# Patient Record
Sex: Male | Born: 1965 | Race: Black or African American | Hispanic: No | Marital: Married
Health system: Midwestern US, Community
[De-identification: ages and names within clinical notes are randomized; demographics above are authoritative.]

## PROBLEM LIST (undated history)

## (undated) DIAGNOSIS — R079 Chest pain, unspecified: Principal | ICD-10-CM

---

## 2018-09-04 ENCOUNTER — Institutional Professional Consult (permissible substitution): Payer: Self-pay | Admitting: Internal Medicine

## 2018-10-02 ENCOUNTER — Other Ambulatory Visit: Payer: Self-pay

## 2018-10-02 ENCOUNTER — Ambulatory Visit (INDEPENDENT_AMBULATORY_CARE_PROVIDER_SITE_OTHER): Payer: Self-pay

## 2018-10-02 ENCOUNTER — Encounter: Payer: Self-pay | Admitting: Internal Medicine

## 2018-10-02 ENCOUNTER — Ambulatory Visit (INDEPENDENT_AMBULATORY_CARE_PROVIDER_SITE_OTHER): Payer: Worker's Compensation | Admitting: Internal Medicine

## 2018-10-02 VITALS — BP 126/74 | HR 90 | Temp 98.4°F | Ht 78.0 in | Wt 330.6 lb

## 2018-10-02 DIAGNOSIS — R0789 Other chest pain: Secondary | ICD-10-CM

## 2018-10-02 DIAGNOSIS — R0609 Other forms of dyspnea: Secondary | ICD-10-CM

## 2018-10-02 NOTE — Patient Instructions (Signed)
Please remember to go to the  x-ray department  for your tests - we will call you with the results when they are available     You may need additional evaluation to address your pain over the lower portion of your right Rib cage but I don't think your Worker's comp will pay for it as unlikely to be related to anything you might have inhaled on the bus.

## 2018-10-02 NOTE — Progress Notes (Signed)
Luane School, male    DOB: 10-10-1965, 53 y.o.   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 and achieved a baseline = push mower x 30 min and walk dog up hills then  Acute exp to diesel fumes 07/28/17 > nausea, diaphoresis R/midline cp which the pt said never had before (notes says intermittent cp was on the L prior condition, pt denies)  > neg cardiac eval /   C0 levels not elevated  and cp/sob since then up to twice a week 2 min - 10 min happens least when lying down on cpap  On side and equal freq sitting vs walking    Baseline wt was 290   Had used nebulizer prior to event for "bronchitis"  Though MCT neg      History of Present Illness  10/02/2018  Pulmonary/ 1st office eval/[facility]  Chief Complaint  Patient presents with  . Pulmonary Consult    Worker's Comp. 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 He c/o DOE walking short distances such as lobby to exam room today.   Dyspnea:  MMRC3 = can't walk 100 yards even at a slow pace at a flat grade s stopping due to sob   Cough: gone now Sleep:  Ok with cpap  SABA use: not using /don't help sob   No obvious day to day or daytime variability or assoc excess/ purulent sputum or mucus plugs or hemoptysis or   chest tightness, subjective wheeze or overt sinus or hb symptoms.   Sleeping as above without nocturnal  or early am exacerbation  of respiratory  c/o's or need for noct saba. Also denies any obvious fluctuation of symptoms with weather or environmental changes or other aggravating or alleviating factors except as outlined above   No unusual exposure hx or h/o childhood pna/ asthma or knowledge of  premature birth.  Current Allergies, Complete Past Medical History, Past Surgical History, Family History, and Social History were reviewed in [employer] record.  ROS  The following are not active complaints unless bolded Hoarseness, sore throat, dysphagia, dental problems, itching, sneezing,  nasal congestion or discharge of excess mucus or purulent secretions, ear ache,   fever, chills, sweats, unintended wt loss or wt gain, classically pleuritic  cp,  orthopnea pnd or arm/hand swelling  or leg swelling, presyncope, palpitations, abdominal pain, anorexia, nausea, vomiting, diarrhea  or change in bowel habits or change in bladder habits, change in stools or change in urine, dysuria, hematuria,  rash, arthralgias, visual complaints, headache, numbness, weakness or ataxia or problems with walking or coordination,  change in mood or  memory.           No past medical history on file.  Outpatient Medications Prior to Visit  Medication Sig Dispense Refill  . acetaminophen (TYLENOL) 325 MG tablet Take 1 tablet by mouth every 6 (six) hours as needed.    Marland Kitchen albuterol (ACCUNEB) 1.25 MG/3ML nebulizer solution Take 1 ampule by nebulization every 6 (six) hours as needed for wheezing.    Marland Kitchen albuterol (VENTOLIN HFA) 108 (90 Base) MCG/ACT inhaler Inhale 2 puffs into the lungs every  6 (six) hours as needed for wheezing or shortness of breath.    Marland Kitchen atorvastatin (LIPITOR) 20 MG tablet Take 1 tablet by mouth daily.    . citalopram (CELEXA) 20 MG tablet Take 1 tablet by mouth daily.    . finasteride (PROSCAR) 5 MG tablet Take 1 tablet by mouth daily.    Marland Kitchen gabapentin (NEURONTIN) 300 MG capsule Take 1 capsule by mouth 3 (three) times daily.    Marland Kitchen glipiZIDE (GLUCOTROL XL) 5 MG 24 hr tablet Take 1 tablet by mouth daily.    . metFORMIN (GLUCOPHAGE-XR) 500 MG 24 hr tablet Take 2 tablets by mouth 2 (two) times a day.    . verapamil (CALAN-SR) 180 MG CR tablet Take 1 tablet by mouth daily.    Marland Kitchen  zolpidem (AMBIEN) 5 MG tablet Take 1 tablet by mouth at bedtime as needed.        Objective:     BP 126/74 (BP Location: Left Arm, Cuff Size: Large)   Pulse 90   Temp 98.4 F (36.9 C) (Oral)   Ht 6\' 6"  (1.981 m)   Wt (!) 330 lb 9.6 oz (150 kg)   SpO2 98%   BMI 38.20 kg/m   SpO2: 98 %   RA  Pleasant obese amb bm nad   HEENT: nl dentition, turbinates bilaterally, and oropharynx. Nl external ear canals without cough reflex   NECK :  without JVD/Nodes/TM/ nl carotid upstrokes bilaterally   LUNGS: no acc muscle use,  Nl contour chest which is clear to A and P bilaterally without cough on insp or exp maneuvers - R ant chest wall with focal tenderness and ? Some sts in mid clavicular line near costophrenic border(note prior exams noted tenderness L ant chest wall)    CV:  RRR  no s3 or murmur or increase in P2, and no edema   ABD:  Obese soft with mild RUQ tenderness ? Pos murphy's but   with nl inspiratory excursion in the supine position. No bruits or organomegaly appreciated, bowel sounds nl  MS:  Nl gait/ ext warm without deformities, calf tenderness, cyanosis or clubbing No obvious joint restrictions   SKIN: warm and dry without lesions    NEURO:  alert, approp, nl sensorium with  no motor or cerebellar deficits apparent.      I personally reviewed images and agree with radiology impression as follows:   Chest CT w contrast 03/09/2018  Non-specific and very mild streaky opacities c/w scarring /atx   CXR PA and Lateral:   10/02/2018 :    I personally reviewed images and agree with radiology impression as follows:   No acute cardiopulmonary process      Assessment   DOE (dyspnea on exertion) Onset p diesel fume exposure 07/28/17 with nl C0 levels in ER  - See pulmonary eval from [facility] 06/14/2018 outlining neg w/u x for ct chest 03/09/18 with mild streaky atx and granulomatous changes ? 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  The nl CO levels rule out significant CO toxicity unless done many hours after started on 100% 02 (which I don't believe this to be the case as  toxic levels wash out that quickly) and the cp does not sound cardiac or pulmonary in natures (see separate a/p).  He may need f/u related to nonspecific scarring seen on CT 03/09/18 but since this is not likely related to work exposure I did not want to rec any studies that his coverage would not pay for and directed him back to his PCP to work out.     Atypical chest pain Assoc with ant chest wall tenderness on L on 07/28/17 and R ant chest wall tenderness  10/02/2018 with lots of gas below diaphragms   No evidence of a cardiac or pulmonary problem causing this atypical chest wall pain syndrome that is clearly migratory and typical of either fibromyagia or more likely a form of ibs:  Classic subdiaphragmatic pain pattern suggests ibs:  Stereotypical, migratory with a very limited distribution of pain locations, daytime, not usually exacerbated by exercise  or coughing, worse in sitting position, frequently associated with generalized abd bloating, not as likely to be present supine due to the dome effect of the diaphragm which  is  canceled in that position. Frequently these patients have had multiple negative cardiac/GI workups and CT scans which is the case here and I didn't add to this today but rec   W/u per PCP since not related to worker's comp issue with trial of ibs diet (not foods that cause gas) and citrucel and then consider abd u/s to complete w/u if not improving on above  Pulmonary f/u is prn    .        Morbid (severe) obesity due to excess calories (HCC) complicated by hbp/DM Body mass index is 38.2  kg/m.    No results found for: TSH   Contributing to gerd risk/ doe/reviewed the need and the process to achieve and maintain neg calorie balance > defer f/u primary care including intermittently monitoring thyroid status         Total time devoted to counseling  > 50 % of initial 60 min office visit:  review case with pt/ personally observed amb 02 sat study/discussion of options/alternatives/ personally creating written customized instructions  in presence of pt  then going over those specific  Instructions directly with the pt including how to use all of the meds but in particular covering each new medication in detail and the difference between the maintenance= "automatic" meds and the prns using an action plan format for the latter (If this problem/symptom => do that organization reading Left to right).  Please see AVS from this visit for a full list of these instructions which I personally wrote for this pt and  are unique to this visit.      Sandrea HughsMichael Edee Nifong, MD 10/02/2018

## 2018-10-03 ENCOUNTER — Encounter: Payer: Self-pay | Admitting: Internal Medicine

## 2018-10-03 DIAGNOSIS — R0789 Other chest pain: Secondary | ICD-10-CM | POA: Insufficient documentation

## 2018-10-03 NOTE — Progress Notes (Signed)
Spoke with pt and notified of results per Dr. Wert. Pt verbalized understanding and denied any questions. 

## 2018-10-03 NOTE — Assessment & Plan Note (Addendum)
Onset p diesel fume exposure 07/28/17 with nl C0 levels in ER  - See pulmonary eval from [facility] 06/14/2018 outlining neg w/u x for ct chest 03/09/18 with mild streaky atx and granulomatous changes ? 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  The nl CO levels rule out significant CO toxicity unless done many hours after started on 100% 02 (which I don't believe this to be the case as toxic levels wash out that quickly) and the cp does not sound cardiac or pulmonary in natures (see separate a/p).  He may need f/u related to nonspecific scarring seen on CT 03/09/18 but since this is not likely related to work exposure I did not want to rec any studies that his coverage would not pay for and directed him back to his PCP to work out.

## 2018-10-03 NOTE — Assessment & Plan Note (Addendum)
Body mass index is 38.2 kg/m.   No results found for: TSH   Contributing to gerd risk/ doe/reviewed the need and the process to achieve and maintain neg calorie balance > defer f/u primary care including intermittently monitoring thyroid status       Total time devoted to counseling  > 50 % of initial 60 min office visit:  review case with pt/personally observed amb 02 sat study/  discussion of options/alternatives/ personally creating written customized instructions  in presence of pt  then going over those specific  Instructions directly with the pt including how to use all of the meds but in particular covering each new medication in detail and the difference between the maintenance= "automatic" meds and the prns using an action plan format for the latter (If this problem/symptom => do that organization reading Left to right).  Please see AVS from this visit for a full list of these instructions which I personally wrote for this pt and  are unique to this visit.

## 2018-10-03 NOTE — Assessment & Plan Note (Addendum)
Assoc with ant chest wall tenderness on L on 07/28/17 and R ant chest wall tenderness  10/02/2018 with lots of gas below diaphragms   No evidence of a cardiac or pulmonary problem causing this atypical chest wall pain syndrome that is clearly migratory and typical of either fibromyagia or more likely a form of ibs:  Classic subdiaphragmatic pain pattern suggests ibs:  Stereotypical, migratory with a very limited distribution of pain locations, daytime, not usually exacerbated by exercise  or coughing, worse in sitting position, frequently associated with generalized abd bloating, not as likely to be present supine due to the dome effect of the diaphragm which  is  canceled in that position. Frequently these patients have had multiple negative cardiac/GI workups and CT scans which is the case here and I didn't add to this today but rec   W/u per PCP since not related to worker's comp issue with trial of ibs diet (not foods that cause gas) and citrucel and then consider abd u/s to complete w/u if not improving on above  Pulmonary f/u is prn    .

## 2019-12-05 IMAGING — DX CHEST - 2 VIEW
2 series · 2 of 2 positions shown · non-contrast
Comparison: None.

CLINICAL DATA: Shortness of breath.

EXAM:
CHEST - 2 VIEW

[chest pa]
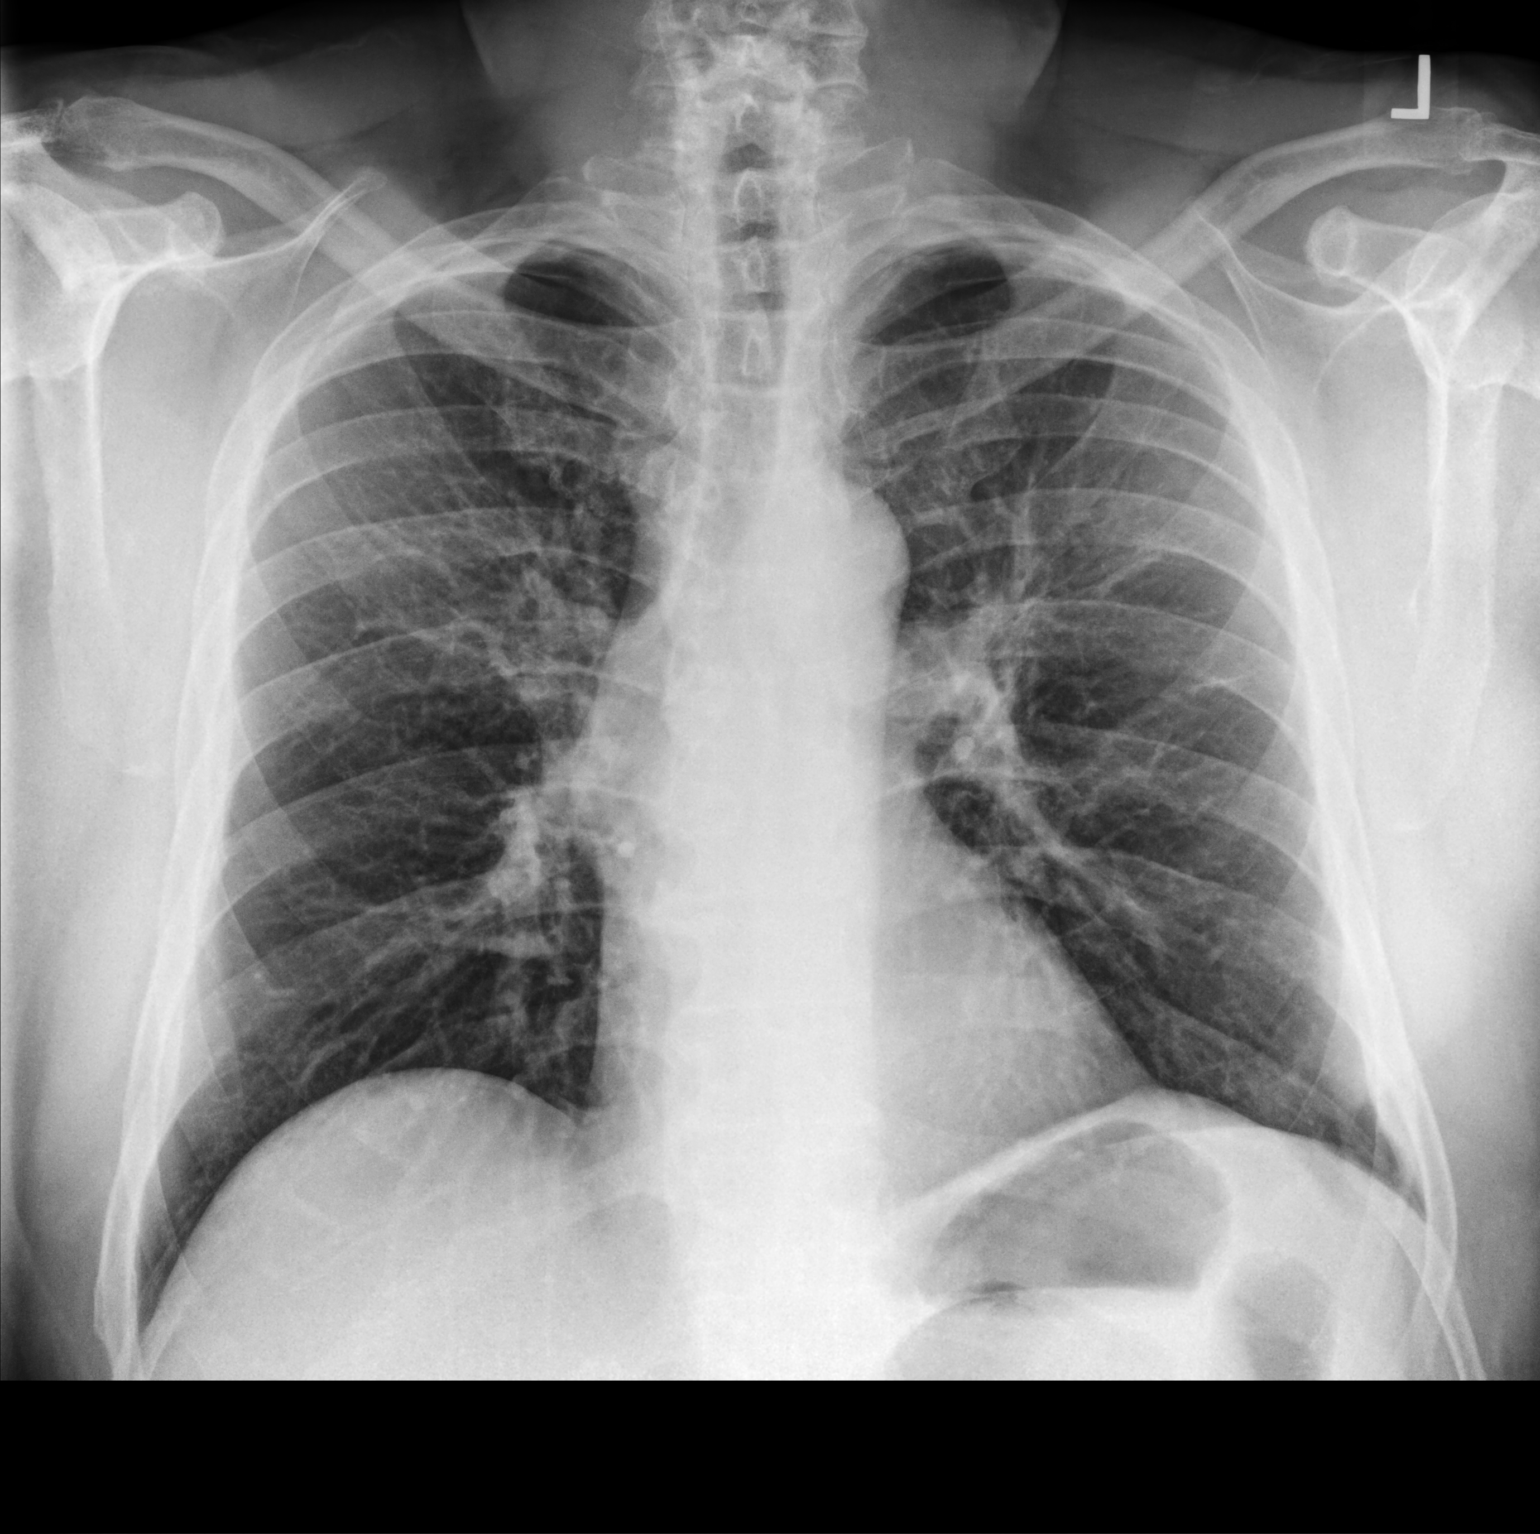

[chest lat]
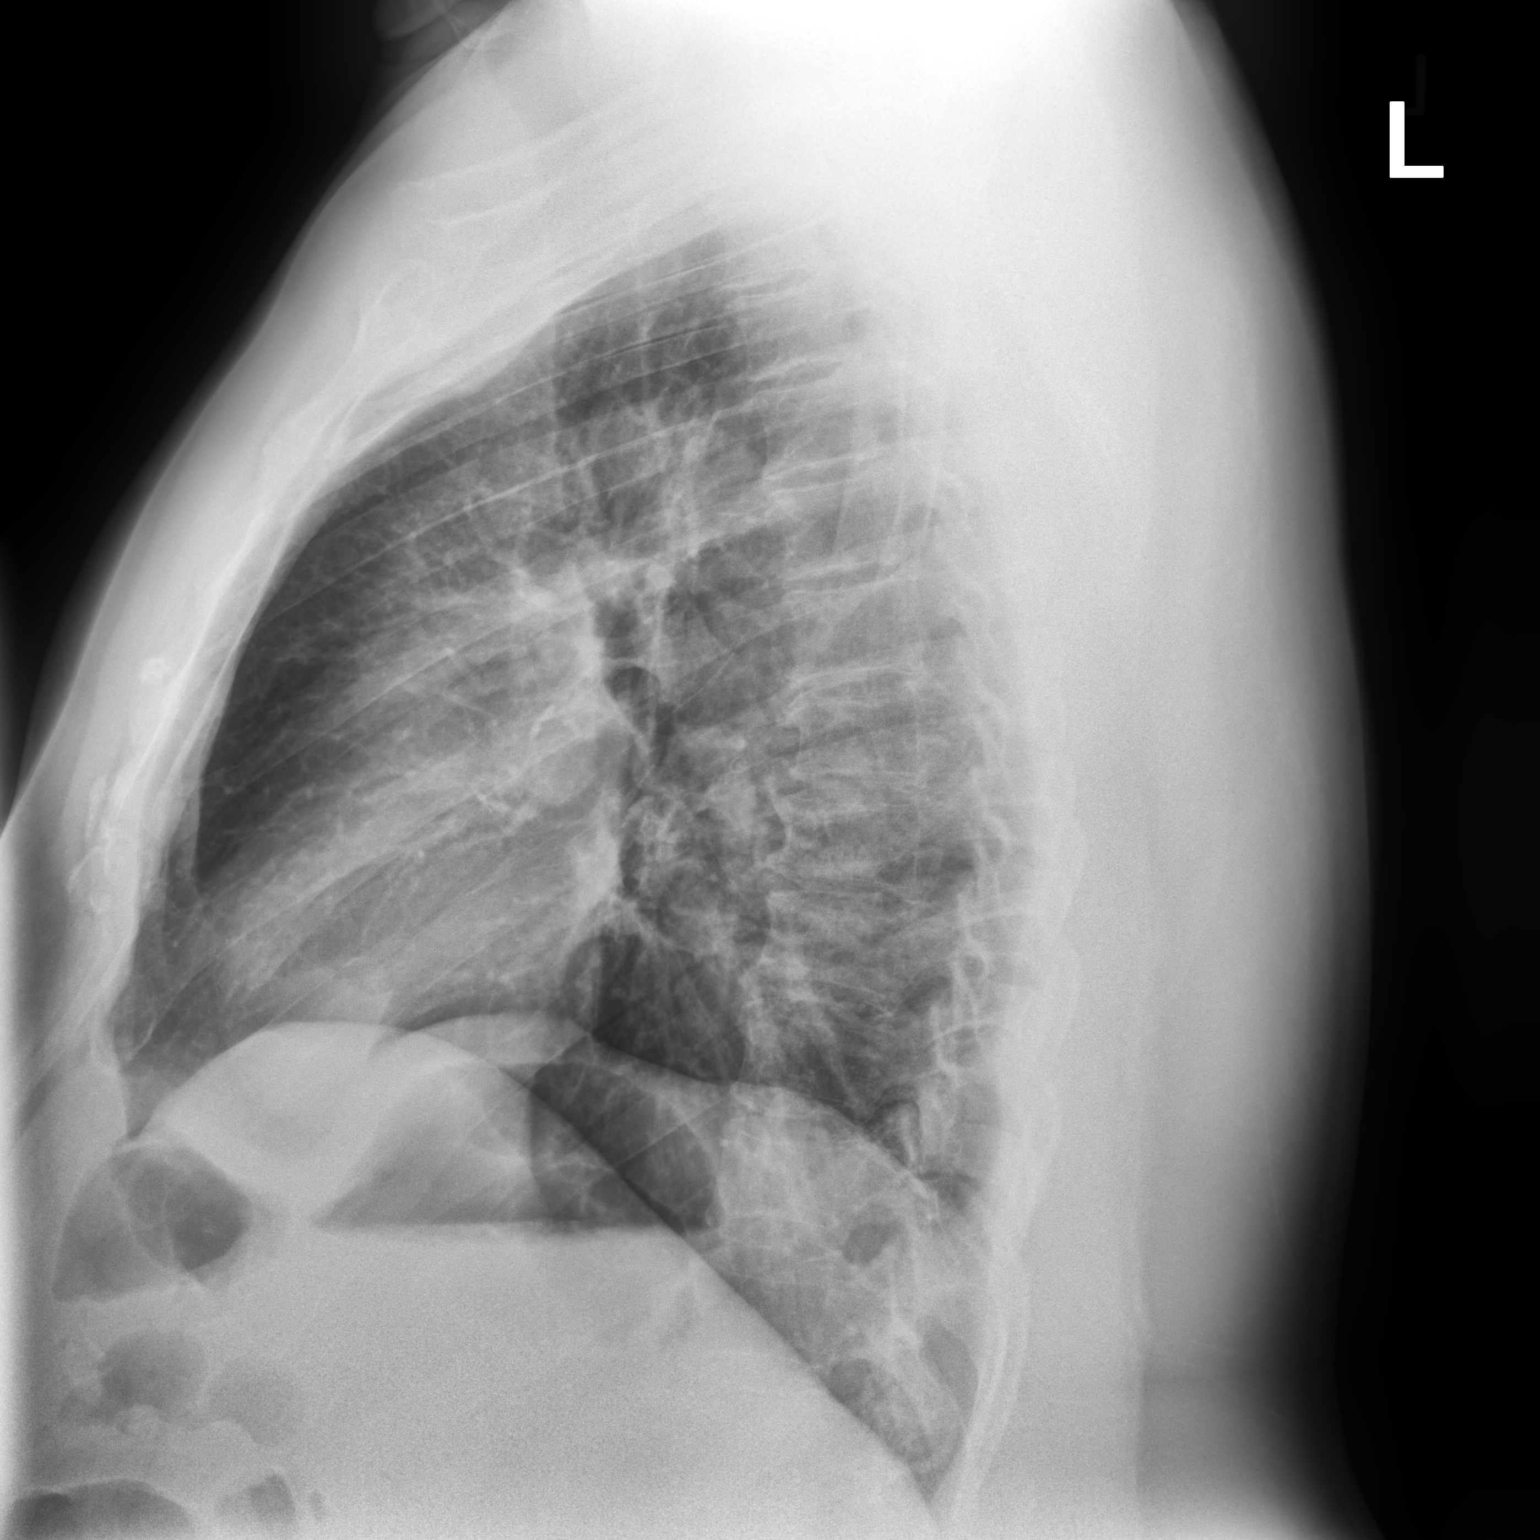

[2 of 2 positions shown; findings below may reference images not displayed]

FINDINGS: Normal cardiac and mediastinal contours. No consolidative pulmonary
opacities. No pleural effusion or pneumothorax. Thoracic spine
degenerative changes.
IMPRESSION: No acute cardiopulmonary process.

## 2021-06-11 ENCOUNTER — Emergency Department: Admit: 2021-06-12 | Payer: MEDICAID

## 2021-06-11 ENCOUNTER — Inpatient Hospital Stay
Admit: 2021-06-11 | Discharge: 2021-06-12 | Disposition: A | Discharge status: Home / Self Care | Payer: MEDICAID | Attending: Emergency Medicine

## 2021-06-11 DIAGNOSIS — R079 Chest pain, unspecified: Secondary | ICD-10-CM

## 2021-06-11 NOTE — ED Triage Notes (Signed)
 Formatting of this note might be different from the original.  Pt states that he was having relations with his wife last night and started to have chest pain.  Has been having chest pain off and on prior to this.   Pain is centered to his left breast,   Electronically signed by Ward, Eddie Good at 06/11/2021  7:16 PM EST

## 2021-06-11 NOTE — ED Notes (Signed)
Pt states that he was having relations with his wife last night and started to have chest pain.  Has been having chest pain off and on prior to this.   Pain is centered to his left breast,

## 2021-06-11 NOTE — ED Provider Notes (Signed)
 Formatting of this note is different from the original.  Pt c/o chest pain, started during exertion one day ago, wax/waning since.  Worse w sitting up.  + sob w exertion.  No fever. No cough.  No leg pain or swelling.  H/o same in past.  Here from out of town.  Due to see cardiology next week for freq chest pain.   H/o iddm, says gluc well controlled.  H/o cva after surgery, w left arm numbness.   Took asa this am.   Pt states h/o chronic renal insufficiency.       Past Medical History:   Diagnosis Date    CKD (chronic kidney disease) stage 2, GFR 60-89 ml/min     CVA (cerebral vascular accident) (HCC)     Diabetes (HCC)     HTN (hypertension)      Past Surgical History:   Procedure Laterality Date    HX BACK SURGERY      HX KNEE REPLACEMENT Left        History reviewed. No pertinent family history.    Social History     Socioeconomic History    Marital status: MARRIED     Spouse name: Not on file    Number of children: Not on file    Years of education: Not on file    Highest education level: Not on file   Occupational History    Not on file   Tobacco Use    Smoking status: Never    Smokeless tobacco: Never   Substance and Sexual Activity    Alcohol use: Not Currently    Drug use: Not Currently    Sexual activity: Not on file   Other Topics Concern    Not on file   Social History Narrative    Not on file     Social Determinants of Health     Financial Resource Strain: Not on file   Food Insecurity: Not on file   Transportation Needs: Not on file   Physical Activity: Not on file   Stress: Not on file   Social Connections: Not on file   Intimate Partner Violence: Not on file   Housing Stability: Not on file     ALLERGIES: Penicillins    Review of Systems   Constitutional:  Negative for diaphoresis and fever.   HENT:  Negative for congestion.    Respiratory:  Positive for shortness of breath. Negative for cough.    Cardiovascular:  Positive for chest pain.   Gastrointestinal:  Negative for abdominal pain and nausea.    Musculoskeletal:  Negative for back pain.   Skin:  Negative for rash.   Neurological:  Negative for dizziness.   All other systems reviewed and are negative.    Vitals:    06/11/21 2015 06/11/21 2115 06/11/21 2217 06/11/21 2315   BP: 131/74 128/80 134/79 (!) 114/55   Pulse: 64 (!) 55 (!) 44 (!) 57   Resp: 16 22 18 16    Temp:       SpO2: 94% 98% 98% 96%   Weight:       Height:           Physical Exam  Vitals and nursing note reviewed.   Constitutional:       Appearance: He is well-developed.   HENT:      Head: Normocephalic and atraumatic.   Eyes:      Conjunctiva/sclera: Conjunctivae normal.   Cardiovascular:      Rate and Rhythm:  Normal rate and regular rhythm.   Pulmonary:      Effort: Pulmonary effort is normal.      Breath sounds: No wheezing.   Abdominal:      Palpations: Abdomen is soft.      Tenderness: There is no abdominal tenderness.   Musculoskeletal:         General: No tenderness.      Cervical back: Normal range of motion.   Skin:     General: Skin is warm and dry.      Capillary Refill: Capillary refill takes less than 2 seconds.      Findings: No rash.   Neurological:      Mental Status: He is alert and oriented to person, place, and time.   Psychiatric:         Mood and Affect: Mood normal.       Medical Decision Making  Amount and/or Complexity of Data Reviewed  Labs: ordered.  Radiology: ordered.  ECG/medicine tests: ordered.    Risk  Prescription drug management.        Procedures    Vitals:  No data found.    Medications ordered:   Medications   0.9% sodium chloride infusion 500 mL (0 mL IntraVENous IV Completed 06/11/21 2005)   cyclobenzaprine (FLEXERIL) tablet 10 mg (10 mg Oral Given 06/11/21 2033)       Lab findings:  No results found for this or any previous visit (from the past 12 hour(s)).    X-Ray, CT or other radiology findings or impressions:  XR CHEST PA LAT   Final Result     1. No evidence of active cardiopulmonary disease.       Progress notes, Consult notes or additional Procedure  notes:   11:51 PM pt feels much better, no pain after flexeril po, no complaints, req dc.  Stable for dc and close f.up.  det ret inst given. No emc. Not c/w acs/pe/ptx/pna/dissection/sepsis/acute abd.  Pt verb und of det ret inst givne and agrees w dc plan.   No ind for cta chest, neg d-dimer.   Pt told of renal insuff, he and spouse says h/o same.  Pcp in [place] is following.     Diagnosis:   1. Chest pain, unspecified type    2. Chronic renal impairment, unspecified CKD stage      Disposition: home    Follow-up Information       Follow up With Specialties Details Why Contact Info    [facility] EMERGENCY DEPT Emergency Medicine Go to  As needed, If symptoms worsen [address]  [place]  [address]  [phone]    Rockford Churches, MD Family Medicine Schedule an appointment as soon as possible for a visit in 2 days  [address]  [address]  [place] [address]  [phone]     Celine Collard, MD Cardio Vascular Surgery, Cardiovascular Disease Physician Schedule an appointment as soon as possible for a visit in 2 days  [address] [place]  [address]  [place] [address]  [phone]            Discharge Medication List as of 06/11/2021 11:51 PM       CONTINUE these medications which have NOT CHANGED    Details   fenofibrate nanocrystallized (TRICOR) 48 mg tablet Take 48 mg by mouth., Historical Med     hydrALAZINE (APRESOLINE) 100 mg tablet Take 100 mg by mouth three (3) times daily., Historical Med  labetaloL (NORMODYNE) 200 mg tablet Take 200 mg by mouth two (2) times a day., Historical Med     NIFEdipine ER (PROCARDIA XL) 90 mg ER tablet Take 90 mg by mouth daily., Historical Med     allopurinoL (ZYLOPRIM) 300 mg tablet Take 300 mg by mouth daily., Historical Med     DULoxetine (CYMBALTA) 30 mg capsule Take 30 mg by mouth daily., Historical Med     aspirin 81 mg chewable tablet Take 81 mg by mouth daily., Historical Med     gabapentin (NEURONTIN) 300 mg capsule Take 300 mg by mouth two (2) times a  day., Historical Med     atorvastatin (LIPITOR) 20 mg tablet Take 20 mg by mouth daily., Historical Med     insulin glargine (LANTUS,BASAGLAR) 100 unit/mL (3 mL) inpn 18 Units by SubCUTAneous route two (2) times a day., Historical Med               Electronically signed by Ever Hiss, MD at 06/13/2021  7:55 PM EST

## 2021-06-11 NOTE — ED Provider Notes (Signed)
Pt c/o chest pain, started during exertion one day ago, wax/waning since.  Worse w sitting up.  + sob w exertion.  No fever. No cough.  No leg pain or swelling.  H/o same in past.  Here from out of town.  Due to see cardiology next week for freq chest pain.   H/o iddm, says gluc well controlled.  H/o cva after surgery, w left arm numbness.   Took asa this am.   Pt states h/o chronic renal insufficiency.        Past Medical History:   Diagnosis Date    CKD (chronic kidney disease) stage 2, GFR 60-89 ml/min     CVA (cerebral vascular accident) ([place])     Diabetes ([place])     HTN (hypertension)        Past Surgical History:   Procedure Laterality Date    HX BACK SURGERY      HX KNEE REPLACEMENT Left          History reviewed. No pertinent family history.    Social History     Socioeconomic History    Marital status: MARRIED     Spouse name: Not on file    Number of children: Not on file    Years of education: Not on file    Highest education level: Not on file   Occupational History    Not on file   Tobacco Use    Smoking status: Never    Smokeless tobacco: Never   Substance and Sexual Activity    Alcohol use: Not Currently    Drug use: Not Currently    Sexual activity: Not on file   Other Topics Concern    Not on file   Social History Narrative    Not on file     Social Determinants of Health     Financial Resource Strain: Not on file   Food Insecurity: Not on file   Transportation Needs: Not on file   Physical Activity: Not on file   Stress: Not on file   Social Connections: Not on file   Intimate Partner Violence: Not on file   Housing Stability: Not on file         ALLERGIES: Penicillins    Review of Systems   Constitutional:  Negative for diaphoresis and fever.   HENT:  Negative for congestion.    Respiratory:  Positive for shortness of breath. Negative for cough.    Cardiovascular:  Positive for chest pain.   Gastrointestinal:  Negative for abdominal pain and nausea.   Musculoskeletal:  Negative for back pain.    Skin:  Negative for rash.   Neurological:  Negative for dizziness.   All other systems reviewed and are negative.    Vitals:    06/11/21 2015 06/11/21 2115 06/11/21 2217 06/11/21 2315   BP: 131/74 128/80 134/79 (!) 114/55   Pulse: 64 (!) 55 (!) 44 (!) 57   Resp: '16 22 18 16   ' Temp:       SpO2: 94% 98% 98% 96%   Weight:       Height:                Physical Exam  Vitals and nursing note reviewed.   Constitutional:       Appearance: He is well-developed.   HENT:      Head: Normocephalic and atraumatic.   Eyes:      Conjunctiva/sclera: Conjunctivae normal.   Cardiovascular:  Rate and Rhythm: Normal rate and regular rhythm.   Pulmonary:      Effort: Pulmonary effort is normal.      Breath sounds: No wheezing.   Abdominal:      Palpations: Abdomen is soft.      Tenderness: There is no abdominal tenderness.   Musculoskeletal:         General: No tenderness.      Cervical back: Normal range of motion.   Skin:     General: Skin is warm and dry.      Capillary Refill: Capillary refill takes less than 2 seconds.      Findings: No rash.   Neurological:      Mental Status: He is alert and oriented to person, place, and time.   Psychiatric:         Mood and Affect: Mood normal.        Medical Decision Making  Amount and/or Complexity of Data Reviewed  Labs: ordered.  Radiology: ordered.  ECG/medicine tests: ordered.    Risk  Prescription drug management.           Procedures      Vitals:  No data found.        Medications ordered:   Medications   0.9% sodium chloride infusion 500 mL (0 mL IntraVENous IV Completed 06/11/21 2005)   cyclobenzaprine (FLEXERIL) tablet 10 mg (10 mg Oral Given 06/11/21 2033)         Lab findings:  No results found for this or any previous visit (from the past 12 hour(s)).          X-Ray, CT or other radiology findings or impressions:  XR CHEST PA LAT   Final Result      1. No evidence of active cardiopulmonary disease.                Progress notes, Consult notes or additional Procedure notes:    11:51 PM pt feels much better, no pain after flexeril po, no complaints, req dc.  Stable for dc and close f.up.  det ret inst given. No emc. Not c/w acs/pe/ptx/pna/dissection/sepsis/acute abd.  Pt verb und of det ret inst givne and agrees w dc plan.   No ind for cta chest, neg d-dimer.   Pt told of renal insuff, he and spouse says h/o same.  Pcp in [place] is following.       Diagnosis:   1. Chest pain, unspecified type    2. Chronic renal impairment, unspecified CKD stage        Disposition: home    Follow-up Information       Follow up With Specialties Details Why Contact Info    [facility] EMERGENCY DEPT Emergency Medicine Go to  As needed, If symptoms worsen [facility]    Arlana Lindau, MD Family Medicine Schedule an appointment as soon as possible for a visit in 2 days  [place] [id number]  [phone]      Floreen Comber, MD [facility], Cardiovascular Disease Physician Schedule an appointment as soon as possible for a visit in 2 days  [address]  [place] [address]  [phone]               Discharge Medication List as of 06/11/2021 11:51 PM        CONTINUE these medications which have NOT CHANGED  Details   fenofibrate nanocrystallized (TRICOR) 48 mg tablet Take 48 mg by mouth., Historical Med      hydrALAZINE (APRESOLINE) 100 mg tablet Take 100 mg by mouth three (3) times daily., Historical Med      labetaloL (NORMODYNE) 200 mg tablet Take 200 mg by mouth two (2) times a day., Historical Med      NIFEdipine ER (PROCARDIA XL) 90 mg ER tablet Take 90 mg by mouth daily., Historical Med      allopurinoL (ZYLOPRIM) 300 mg tablet Take 300 mg by mouth daily., Historical Med      DULoxetine (CYMBALTA) 30 mg capsule Take 30 mg by mouth daily., Historical Med      aspirin 81 mg chewable tablet Take 81 mg by mouth daily., Historical Med      gabapentin (NEURONTIN) 300 mg capsule Take 300 mg by mouth two (2)  times a day., Historical Med      atorvastatin (LIPITOR) 20 mg tablet Take 20 mg by mouth daily., Historical Med      insulin glargine (LANTUS,BASAGLAR) 100 unit/mL (3 mL) inpn 18 Units by SubCUTAneous route two (2) times a day., Historical Med

## 2021-06-11 NOTE — ED Notes (Signed)
Called to bedside by spouse, HR 44-48 on monitor- blood pressure 134/82 continues to have some chest discomfort. Dr Laural Benes at bedside.

## 2021-06-11 NOTE — ED Notes (Signed)
 Formatting of this note might be different from the original.  Called to bedside by spouse, HR 44-48 on monitor- blood pressure 134/82 continues to have some chest discomfort. Dr Lincoln Renshaw at bedside.  Electronically signed by Ruven Coy S at 06/12/2021  3:40 AM EST

## 2021-06-12 LAB — METABOLIC PANEL, COMPREHENSIVE
A-G Ratio: 1.1 (ref 0.8–1.7)
ALT (SGPT): 25 U/L (ref 16–61)
AST (SGOT): 13 U/L (ref 10–38)
Albumin: 3.8 g/dL (ref 3.4–5.0)
Alk. phosphatase: 96 U/L (ref 45–117)
Anion gap: 8 mmol/L (ref 3.0–18.0)
BUN/Creatinine ratio: 16 (ref 12–20)
BUN: 28 mg/dL — ABNORMAL HIGH (ref 7–18)
Bilirubin, total: 0.2 mg/dL (ref 0.2–1.0)
CO2: 27 mmol/L (ref 21–32)
Calcium: 9.2 mg/dL (ref 8.5–10.1)
Chloride: 105 mmol/L (ref 100–111)
Creatinine: 1.71 mg/dL — ABNORMAL HIGH (ref 0.60–1.30)
Globulin: 3.5 g/dL (ref 2.0–4.0)
Glucose: 136 mg/dL — ABNORMAL HIGH (ref 74–99)
Potassium: 3.6 mmol/L (ref 3.5–5.5)
Protein, total: 7.3 g/dL (ref 6.4–8.2)
Sodium: 140 mmol/L (ref 136–145)
eGFR: 47 mL/min/{1.73_m2} — ABNORMAL LOW (ref >60)

## 2021-06-12 LAB — CBC WITH AUTOMATED DIFF
ABS. BASOPHILS: 0.1 10*3/uL (ref 0.0–0.1)
ABS. EOSINOPHILS: 0.2 10*3/uL (ref 0.0–0.4)
ABS. IMM. GRANS.: 0 10*3/uL (ref 0.00–0.04)
ABS. LYMPHOCYTES: 1.5 10*3/uL (ref 0.9–3.6)
ABS. MONOCYTES: 0.5 10*3/uL (ref 0.05–1.2)
ABS. NEUTROPHILS: 3.3 10*3/uL (ref 1.8–8.0)
ABSOLUTE NRBC: 0 10*3/uL (ref 0.00–0.01)
BASOPHILS: 1 % (ref 0–2)
EOSINOPHILS: 4 % (ref 0–5)
HCT: 38.1 % (ref 36.0–48.0)
HGB: 12.7 g/dL — ABNORMAL LOW (ref 13.0–16.0)
IMMATURE GRANULOCYTES: 0 % (ref 0–0.5)
LYMPHOCYTES: 26 % (ref 21–52)
MCH: 27.1 PG (ref 24.0–34.0)
MCHC: 33.3 g/dL (ref 31.0–37.0)
MCV: 81.4 FL (ref 78.0–100.0)
MONOCYTES: 10 % (ref 3–10)
MPV: 11.3 FL (ref 9.2–11.8)
NEUTROPHILS: 59 % (ref 40–73)
NRBC: 0 PER 100 WBC
PLATELET: 214 10*3/uL (ref 135–420)
RBC: 4.68 M/uL (ref 4.35–5.65)
RDW: 13.5 % (ref 11.6–14.5)
WBC: 5.5 10*3/uL (ref 4.6–13.2)

## 2021-06-12 LAB — EKG, 12 LEAD, SUBSEQUENT
Atrial Rate: 47 {beats}/min
Calculated P Axis: 7 degrees
Calculated R Axis: 13 degrees
Calculated T Axis: 32 degrees
P-R Interval: 192 ms
Q-T Interval: 481 ms
QRS Duration: 88 ms
QTC Calculation (Bezet): 430 ms
Ventricular Rate: 48 {beats}/min

## 2021-06-12 LAB — EKG, 12 LEAD, INITIAL
Atrial Rate: 73 {beats}/min
Calculated P Axis: 31 degrees
Calculated R Axis: 5 degrees
Calculated T Axis: 44 degrees
P-R Interval: 184 ms
Q-T Interval: 405 ms
QRS Duration: 85 ms
QTC Calculation (Bezet): 447 ms
Ventricular Rate: 73 {beats}/min

## 2021-06-12 LAB — MAGNESIUM
Magnesium: 2 mg/dL (ref 1.6–2.6)
Magnesium: 2 mg/dL (ref 1.6–2.6)

## 2021-06-12 LAB — TROPONIN-HIGH SENSITIVITY
Troponin-High Sensitivity: 9 ng/L (ref 0–78)
Troponin-High Sensitivity: 8 ng/L (ref 0–78)

## 2021-06-12 LAB — D DIMER: D DIMER: 0.35 ug/ml(FEU) (ref <0.46)

## 2021-06-12 LAB — LIPASE
Lipase: 92 U/L (ref 73–393)
Lipase: 92 U/L (ref 73–393)

## 2021-06-12 LAB — NT-PRO BNP: NT pro-BNP: 33 pg/mL (ref 0–900)

## 2021-06-12 LAB — COMPREHENSIVE METABOLIC PANEL
ALT: 25 U/L (ref 16–61)
AST: 13 U/L (ref 10–38)
Albumin/Globulin Ratio: 1.1 (ref 0.8–1.7)
Albumin: 3.8 g/dL (ref 3.4–5.0)
Alkaline Phosphatase: 96 U/L (ref 45–117)
Anion Gap: 8 mmol/L (ref 3.0–18.0)
BUN: 28 mg/dL — ABNORMAL HIGH (ref 7–18)
Bun/Cre Ratio: 16 (ref 12–20)
CO2: 27 mmol/L (ref 21–32)
Calcium: 9.2 mg/dL (ref 8.5–10.1)
Chloride: 105 mmol/L (ref 100–111)
Creatinine: 1.71 mg/dL — ABNORMAL HIGH (ref 0.60–1.30)
ESTIMATED GLOMERULAR FILTRATION RATE: 47 mL/min/{1.73_m2} — ABNORMAL LOW (ref >60)
Globulin: 3.5 g/dL (ref 2.0–4.0)
Glucose: 136 mg/dL — ABNORMAL HIGH (ref 74–99)
Potassium: 3.6 mmol/L (ref 3.5–5.5)
Sodium: 140 mmol/L (ref 136–145)
Total Bilirubin: 0.2 mg/dL (ref 0.2–1.0)
Total Protein: 7.3 g/dL (ref 6.4–8.2)

## 2021-06-12 LAB — CBC WITH AUTO DIFFERENTIAL
Basophils %: 1 % (ref 0–2)
Basophils Absolute: 0.1 10*3/uL (ref 0.0–0.1)
Eosinophils %: 4 % (ref 0–5)
Eosinophils Absolute: 0.2 10*3/uL (ref 0.0–0.4)
Granulocyte Absolute Count: 0 10*3/uL (ref 0.00–0.04)
Hematocrit: 38.1 % (ref 36.0–48.0)
Hemoglobin: 12.7 g/dL — ABNORMAL LOW (ref 13.0–16.0)
Immature Granulocytes: 0 % (ref 0–0.5)
Lymphocytes %: 26 % (ref 21–52)
Lymphocytes Absolute: 1.5 10*3/uL (ref 0.9–3.6)
MCH: 27.1 PG (ref 24.0–34.0)
MCHC: 33.3 g/dL (ref 31.0–37.0)
MCV: 81.4 FL (ref 78.0–100.0)
MPV: 11.3 FL (ref 9.2–11.8)
Monocytes %: 10 % (ref 3–10)
Monocytes Absolute: 0.5 10*3/uL (ref 0.05–1.2)
NRBC Absolute: 0 10*3/uL (ref 0.00–0.01)
Neutrophils %: 59 % (ref 40–73)
Neutrophils Absolute: 3.3 10*3/uL (ref 1.8–8.0)
Nucleated RBCs: 0 PER 100 WBC
Platelets: 214 10*3/uL (ref 135–420)
RBC: 4.68 M/uL (ref 4.35–5.65)
RDW: 13.5 % (ref 11.6–14.5)
WBC: 5.5 10*3/uL (ref 4.6–13.2)

## 2021-06-12 LAB — EKG 12-LEAD
Atrial Rate: 47 {beats}/min
Atrial Rate: 73 {beats}/min
P Axis: 31 degrees
P Axis: 7 degrees
P-R Interval: 184 ms
P-R Interval: 192 ms
Q-T Interval: 405 ms
Q-T Interval: 481 ms
QRS Duration: 85 ms
QRS Duration: 88 ms
QTc Calculation (Bazett): 430 ms
QTc Calculation (Bazett): 447 ms
R Axis: 13 degrees
R Axis: 5 degrees
T Axis: 32 degrees
T Axis: 44 degrees
Ventricular Rate: 48 {beats}/min
Ventricular Rate: 73 {beats}/min

## 2021-06-12 LAB — TROPONIN, HIGH SENSITIVITY
Troponin, High Sensitivity: 9 ng/L (ref 0–78)
Troponin, High Sensitivity: 8 ng/L (ref 0–78)

## 2021-06-12 LAB — D-DIMER, QUANTITATIVE: D-Dimer, Quant: 0.35 ug/ml(FEU) (ref <0.46)

## 2021-06-12 LAB — PROBNP, N-TERMINAL: BNP: 33 pg/mL (ref 0–900)

## 2021-06-12 MED ORDER — SODIUM CHLORIDE 0.9 % IV
Freq: Once | INTRAVENOUS | Status: AC
Start: 2021-06-12 — End: 2021-06-11
  Administered 2021-06-12: via INTRAVENOUS

## 2021-06-12 MED ORDER — CYCLOBENZAPRINE 10 MG TAB
10 mg | ORAL | Status: AC
Start: 2021-06-12 — End: 2021-06-11
  Administered 2021-06-12: 02:00:00 via ORAL

## 2021-06-12 MED ORDER — HYDROCODONE-ACETAMINOPHEN 5 MG-325 MG TAB
5-325 mg | ORAL | Status: DC
Start: 2021-06-12 — End: 2021-06-12

## 2021-06-12 MED FILL — SODIUM CHLORIDE 0.9 % IV: INTRAVENOUS | Qty: 1000

## 2021-06-12 MED FILL — HYDROCODONE-ACETAMINOPHEN 5 MG-325 MG TAB: 5-325 mg | ORAL | Qty: 1

## 2021-06-12 MED FILL — CYCLOBENZAPRINE 10 MG TAB: 10 mg | ORAL | Qty: 1

## 2021-06-12 NOTE — ED Notes (Signed)
I have reviewed discharge instructions with the patient and spouse.  The patient and spouse verbalized understanding.           Reviewed medication compliance, follow up with PCP, return to ER for any new or worrisome concerns.

## 2021-06-12 NOTE — ED Notes (Signed)
 Formatting of this note might be different from the original.  I have reviewed discharge instructions with the patient and spouse.  The patient and spouse verbalized understanding.     Reviewed medication compliance, follow up with PCP, return to ER for any new or worrisome concerns.  Electronically signed by Ruven Coy S at 06/12/2021  3:53 AM EST

## 2023-11-02 ENCOUNTER — Inpatient Hospital Stay
Admit: 2023-11-02 | Discharge: 2023-11-02 | Disposition: A | Discharge status: Home / Self Care | Payer: MEDICARE | Arrived: VH | Attending: Emergency Medicine

## 2023-11-02 DIAGNOSIS — T7840XA Allergy, unspecified, initial encounter: Secondary | ICD-10-CM

## 2023-11-02 MED ORDER — CLOTRIMAZOLE-BETAMETHASONE 1-0.05 % EX CREA
1-0.05 | CUTANEOUS | 0 refills | 22.00000 days | Status: DC
Start: 2023-11-02 — End: 2023-11-02

## 2023-11-02 MED ORDER — HYDROXYZINE HCL 25 MG PO TABS
25 | ORAL_TABLET | Freq: Three times a day (TID) | ORAL | 0 refills | 30.00000 days | Status: AC | PRN
Start: 2023-11-02 — End: 2023-11-09

## 2023-11-02 MED ORDER — PREDNISONE 20 MG PO TABS
20 | ORAL_TABLET | Freq: Every day | ORAL | 0 refills | 7.00000 days | Status: DC
Start: 2023-11-02 — End: 2023-11-02

## 2023-11-02 MED ORDER — CLOTRIMAZOLE-BETAMETHASONE 1-0.05 % EX CREA
1-0.05 | CUTANEOUS | 0 refills | 22.00000 days | Status: AC
Start: 2023-11-02 — End: ?

## 2023-11-02 MED ORDER — HYDROXYZINE HCL 25 MG PO TABS
25 | ORAL_TABLET | Freq: Three times a day (TID) | ORAL | 0 refills | 30.00000 days | Status: DC | PRN
Start: 2023-11-02 — End: 2023-11-02

## 2023-11-02 MED ORDER — SODIUM CHLORIDE (PF) 0.9 % IJ SOLN
0.9 | INTRAMUSCULAR | Status: AC
Start: 2023-11-02 — End: 2023-11-02
  Administered 2023-11-02: 17:00:00 20 mg via INTRAVENOUS

## 2023-11-02 MED ORDER — STERILE WATER FOR INJECTION (MIXTURES ONLY)
125 | INTRAMUSCULAR | Status: AC
Start: 2023-11-02 — End: 2023-11-02
  Administered 2023-11-02: 17:00:00 125 mg via INTRAVENOUS

## 2023-11-02 MED ORDER — PREDNISONE 20 MG PO TABS
20 | ORAL_TABLET | Freq: Every day | ORAL | 0 refills | 7.00000 days | Status: AC
Start: 2023-11-02 — End: 2023-11-09

## 2023-11-02 MED ORDER — DIPHENHYDRAMINE HCL 50 MG/ML IJ SOLN
50 | INTRAMUSCULAR | Status: AC
Start: 2023-11-02 — End: 2023-11-02
  Administered 2023-11-02: 17:00:00 25 mg via INTRAVENOUS

## 2023-11-02 MED FILL — METHYLPREDNISOLONE SODIUM SUCC 125 MG IJ SOLR: 125 MG | INTRAMUSCULAR | Qty: 125 | Fill #0

## 2023-11-02 MED FILL — FAMOTIDINE (PF) 20 MG/2ML IV SOLN: 20 MG/2ML | INTRAVENOUS | Qty: 2 | Fill #0

## 2023-11-02 MED FILL — DIPHENHYDRAMINE HCL 50 MG/ML IJ SOLN: 50 MG/ML | INTRAMUSCULAR | Qty: 1 | Fill #0

## 2023-11-02 NOTE — ED Triage Notes (Signed)
 States that he dyed his beard on Monday. Began having an allergic reaction yesterday. Worse today. Noted raised fine rash to skin under beard and anterior neck. No respiratory distress or discomfort. Inside of his mouth is WNL.

## 2023-11-02 NOTE — ED Provider Notes (Incomplete)
 [facility] EMERGENCY DEPARTMENT  EMERGENCY DEPARTMENT ENCOUNTER       Pt Name: Mitchell Mitchell  MRN: [id number]  Birthdate 28-Dec-1965  Date of evaluation: 11/02/2023  Provider: Levern Reader, MD   PCP: No primary care provider on file.  Note Started: 12:10 PM 11/02/23     CHIEF COMPLAINT       Chief Complaint   Patient presents with    Allergic Reaction        HISTORY OF PRESENT ILLNESS: 1 or more elements      History From: Patient and Patient's Wife  History limited by: Nothing     Mitchell Mitchell is a 58 y.o. male who presents to ED      Nursing Notes were all reviewed and agreed with or any disagreements were addressed in the HPI.     REVIEW OF SYSTEMS      Review of Systems     Positives and Pertinent negatives as per HPI.    PAST HISTORY     Past Medical History:  Past Medical History:   Diagnosis Date    CKD (chronic kidney disease) stage 2, GFR 60-89 ml/min     CVA (cerebral vascular accident) ([facility])     Diabetes ([facility])     HTN (hypertension)     HTN (hypertension)        Past Surgical History:  Past Surgical History:   Procedure Laterality Date    BACK SURGERY      TOTAL KNEE ARTHROPLASTY Left        Family History:  History reviewed. No pertinent family history.    Social History:  Social History     Tobacco Use    Smoking status: Never    Smokeless tobacco: Never   Substance Use Topics    Alcohol use: Not Currently    Drug use: Not Currently       Allergies:  Allergies   Allergen Reactions    Pcn [Penicillins] Hives       CURRENT MEDICATIONS      Previous Medications    No medications on file       SCREENINGS               No data recorded         PHYSICAL EXAM      Vitals:    11/02/23 1140 11/02/23 1150   BP:  (!) 144/83   Pulse:  79   Resp:  20   Temp:  98.1 F (36.7 C)   SpO2:  99%   Weight: (!) 139.7 kg (308 lb)    Height: 2.032 m (6\' 8" )      Physical Exam    Nursing notes and vital signs reviewed  ***  Constitutional: Non toxic appearing, moderate distress  Head: Normocephalic,  Atraumatic  Eyes: EOMI  Neck: Supple  Cardiovascular: Regular rate and rhythm, no murmurs, rubs, or gallops  Chest: Normal work of breathing and chest excursion bilaterally  Lungs: Clear to ausculation bilaterally  Abdomen: Soft, non tender, non distended, normoactive bowel sounds  Back: No evidence of trauma or deformity  Extremities: No evidence of trauma or deformity, no LE edema  Skin: Warm and dry, normal cap refill  Neuro: Alert and appropriate, CN intact, normal speech, strength and sensation full and symmetric bilaterally, normal gait, normal coordination  Psychiatric: Normal mood and affect     DIAGNOSTIC RESULTS   LABS:     Labs Reviewed - No data to display  EKG:   EKG interpretation: (Preliminary)  EKG read by Dr. Asa Lauth Musapatikeat ***     RADIOLOGY:  Non-plain film images such as CT, Ultrasound and MRI are read by the radiologist. Plain radiographic images are visualized and preliminarily interpreted by the ED Provider with the below findings:     ***     Interpretation per the Radiologist below, if available at the time of this note:     No orders to display        PROCEDURES   Unless otherwise noted below, none  Procedures     CRITICAL CARE TIME   ***    EMERGENCY DEPARTMENT COURSE and DIFFERENTIAL DIAGNOSIS/MDM   Vitals:    Vitals:    11/02/23 1140 11/02/23 1150   BP:  (!) 144/83   Pulse:  79   Resp:  20   Temp:  98.1 F (36.7 C)   SpO2:  99%   Weight: (!) 139.7 kg (308 lb)    Height: 2.032 m (6\' 8" )         Patient was given the following medications:  Medications   methylPREDNISolone sodium succ (SOLU-MEDROL) 125 mg in sterile water 2 mL injection (has no administration in time range)   diphenhydrAMINE (BENADRYL) injection 25 mg (has no administration in time range)   famotidine (PEPCID) 20 MG/2ML 20 mg in sodium chloride (PF) 0.9 % 10 mL injection (has no administration in time range)       CONSULTS: (Who and What was discussed)  None    Chronic Conditions: ***    Social Determinants affecting  Dx or Tx: {Social Barriers:40868}    Records Reviewed (source and summary): {Records review:56237::"Old medical records.","Previous electrocardiograms.","Nursing notes.","Previous radiology studies,""None."}      CC/HPI Summary, DDx, ED Course, and Reassessment: ***         Disposition Considerations (Tests not done, Shared Decision Making, Pt Expectation of Test or Tx.): ***     FINAL IMPRESSION   No diagnosis found.     DISPOSITION/PLAN   DISPOSITION                 {Disposition:55874}     PATIENT REFERRED TO:  No follow-up provider specified.     DISCHARGE MEDICATIONS:  There are no discharge medications for this patient.      DISCONTINUED MEDICATIONS:  There are no discharge medications for this patient.      I am the Primary Clinician of Record.   Braden Deloach, MD (electronically signed)    (Please note that parts of this dictation were completed with voice recognition software. Quite often unanticipated grammatical, syntax, homophones, and other interpretive errors are inadvertently transcribed by the computer software. Please disregards these errors. Please excuse any errors that have escaped final proofreading.)

## 2023-11-22 NOTE — Progress Notes (Signed)
 [facility] *[id number]       ARBY SALT, MD       Mitchell Mitchell  58 y.o., male       Date of birth: 27-Jul-1965    Date of Service: 11/22/23     Presents for Follow-up and problems listed below.  Problems addressed this visit:     Follow up    Hypertension:    BP Readings from Last 3 Encounters:   11/22/23 125/82   09/01/22 (!) 157/99   07/09/22 (!) 156/91     Any new issues with BP: [x] No  [] Yes:  Taking all BP meds: [x] Yes  [] No:  Plan:  stable, continue current treatment plan    Diabetes:   Lab Results   Component Value Date    HGBA1C 10.7 (H) 11/22/2023      Average BS:[] 100-130   [] 130-150   [] 150-200   [] 200-250   [] >250  How often checking BS:  [] Daily  [] BID  [] TID  [] QID  [x]  Occasionally  []  CGM [] Not checking   Problems with medications:[] No    [] Yes:  Plan: diet stressed and need blood work    Osteoarthritis:    Right calf tenderness since last night  New issues: [] No   [x] Yes:  Joint Pain: [] Knees [] Shoulders [] Hips  [] Hands  [] Neck [] Back  [] Feet  Plan: will order doppler    Obstructive sleep apnea:   Explained the benefits of sleep study and patient is in agreement  On CPAP: [] Yes  [x] No  Uses CPAP: [] Every night  [] Most nights  [] Occasionally  Daytime sleepiness or fatigue: [x] Yes  [] No  Benefits from use: [] Yes  [] No  Plan:  needs sleep study     Complains of knee pain:    [x] Right knee  [] Left knee     Duration: [] 1-2 days  [x] 3-4 days  [] 5-6 days  [] 1 week  [] >1week  [] Months  Injury: [] No  [] Yes:  Location of pain: [] Medial  [] Lateral  [] Anterior  [] Posterior  Pain on: [] Standing   [] Sitting  [] Walking/Running  [] Climbing stairs  Locking of Joint: [] Yes  [] No     Popping: [] Yes  [] No   Leg gives away: [] Yes  [] No  Treatment tried: [] NSAIDS  [] Acetaminophen  [] Opioids   [] OTC creams/rubs/patches [] Steroids  [] Ice  [] Nothing  Other:  Symptoms are: [x] The same  [] Improving  [] Worsening  Issues or treatment for this in past: []  No  [] Yes:  Plan:  xrays  ordered   Had allergic reaction 2 weeks ago and steroid and that cause sugar to go up  VISIT DIAGNOSIS    Medications continued, reviewed, started per encounter summary  Diagnoses and all orders for this visit:  Screening for malignant neoplasm of prostate  -     CBC With Differential/Platelet (LC 994990); Future  -     Comprehensive metabolic panel; Future  -     D-dimer, quantitative; Future  -     TSH; Future  -     Lipid panel; Future  -     PSA; Future  -     Hemoglobin A1c; Future  -     Microalbumin / creatinine, urine ratio; Future  Primary hypertension  -     labetalol  (Normodyne ) 200 mg tablet; Take 1 tablet (200 mg) by mouth in the morning.  -     CBC With Differential/Platelet (LC 994990); Future  -     Comprehensive metabolic panel; Future  -  D-dimer, quantitative; Future  -     TSH; Future  -     Lipid panel; Future  -     PSA; Future  -     Hemoglobin A1c; Future  -     Microalbumin / creatinine, urine ratio; Future  Type 2 diabetes mellitus without complication, with long-term current use of insulin (HCC)  -     POCT glucose manually resulted  -     insulin NPH (Isophane) (HumuLIN N,NovoLIN N) injection 4 Units  -     CBC With Differential/Platelet (LC 994990); Future  -     Comprehensive metabolic panel; Future  -     D-dimer, quantitative; Future  -     TSH; Future  -     Lipid panel; Future  -     PSA; Future  -     Hemoglobin A1c; Future  -     Microalbumin / creatinine, urine ratio; Future  Osteoarthritis of right knee, unspecified osteoarthritis type  -     gabapentin  (Neurontin ) 300 mg capsule; Take 1 capsule (300 mg) by mouth in the morning and at bedtime.  -     CBC With Differential/Platelet (LC 994990); Future  -     Comprehensive metabolic panel; Future  -     D-dimer, quantitative; Future  -     TSH; Future  -     Lipid panel; Future  -     PSA; Future  -     Hemoglobin A1c; Future  -     Microalbumin / creatinine, urine ratio; Future  -     XR knee 3 views right;  Future  Neuropathy  -     gabapentin  (Neurontin ) 300 mg capsule; Take 1 capsule (300 mg) by mouth in the morning and at bedtime.  -     CBC With Differential/Platelet (LC 994990); Future  -     Comprehensive metabolic panel; Future  -     D-dimer, quantitative; Future  -     TSH; Future  -     Lipid panel; Future  -     PSA; Future  -     Hemoglobin A1c; Future  -     Microalbumin / creatinine, urine ratio; Future  Other specified diabetes mellitus without complication, with long-term current use of insulin (HCC)  -     insulin lispro (HumaLOG KwikPen Insulin) 100 unit/mL injection; Take on sliding scale 200-250 (2 units); 251-300 (4 units); 301-350 (6 units); 351-400 (8 units); more than 400 (10 units)  -     CBC With Differential/Platelet (LC 994990); Future  -     Comprehensive metabolic panel; Future  -     D-dimer, quantitative; Future  -     TSH; Future  -     Lipid panel; Future  -     PSA; Future  -     Hemoglobin A1c; Future  -     Microalbumin / creatinine, urine ratio; Future  Type 2 diabetes mellitus without complication, without long-term current use of insulin (HCC)  -     insulin glargine (Lantus) 100 unit/mL (3 mL) pen; 10 units twice a day  Dyslipidemia  -     atorvastatin (Lipitor) 20 mg tablet; 1 tablet Once a day  Tenderness of right calf  -     lower extremity venous duplex right; Future  OSA (obstructive sleep apnea)  -     Ambulatory referral to Sleep Studies;  Future  Other orders  -     montelukast  (Singulair ) 10 mg tablet; Take 1 tablet (10 mg) by mouth in the morning.  -     hydrALAZINE  (Apresoline ) 100 mg tablet; Take 0.5 tablets (50 mg) by mouth in the morning and at bedtime.  -     furosemide  (Lasix ) 40 mg tablet; Take 1 tablet (40 mg) by mouth in the morning.       OBJECTIVE     Vitals:    11/22/23 1512   BP: 125/82   BP Location: Left arm   Patient Position: Sitting   BP Cuff Size: Large adult   Pulse: 71   Resp: 18   Temp: 36.6 C (97.8 F)   TempSrc: Oral   SpO2: 94%   Weight: 142 kg  (313 lb)   Height: 6' 6 (1.981 m)       EXAM   Physical Exam  Vitals and nursing note reviewed.   Constitutional:       General: He is not in acute distress.     Appearance: Normal appearance. He is well-developed. He is not ill-appearing.   HENT:      Head: Normocephalic and atraumatic.      Right Ear: Tympanic membrane normal.      Left Ear: Tympanic membrane normal.      Ears:      Comments: Has bilateral hearing aids  Eyes:      Conjunctiva/sclera: Conjunctivae normal.      Pupils: Pupils are equal, round, and reactive to light.   Neck:      Thyroid: No thyromegaly.      Vascular: No carotid bruit.   Cardiovascular:      Rate and Rhythm: Normal rate and regular rhythm.      Heart sounds: Normal heart sounds. No murmur heard.  Pulmonary:      Effort: Pulmonary effort is normal.      Breath sounds: Normal breath sounds.   Abdominal:      General: Bowel sounds are normal. There is no distension.      Palpations: Abdomen is soft. There is no mass.      Tenderness: There is no guarding or rebound.   Musculoskeletal:         General: Tenderness (of the right knee) present. No swelling.      Cervical back: Neck supple.      Comments: No homan sign   Feet:      Right foot:      Toenail Condition: Right toenails are abnormally thick. Fungal disease present.     Left foot:      Toenail Condition: Left toenails are abnormally thick. Fungal disease present.  Lymphadenopathy:      Cervical: No cervical adenopathy.   Skin:     General: Skin is warm and dry.      Findings: No rash.   Neurological:      General: No focal deficit present.      Mental Status: He is alert and oriented to person, place, and time.      Sensory: No sensory deficit.   Psychiatric:         Behavior: Behavior normal.

## 2023-12-20 DIAGNOSIS — I2694 Multiple subsegmental pulmonary emboli without acute cor pulmonale: Principal | ICD-10-CM

## 2023-12-20 NOTE — ED Triage Notes (Signed)
 Reports had scheduled CT of chest in [facility] today ordered by PCP for some dyspnea. States when getting home was advised to come to ER due to finding of pulmonary embolism on CT scan. Reports did take 1 ASA as instructed no chest pain or symptoms besides DOE.   Did have 3-4 hour trip to [facility] and back

## 2023-12-20 NOTE — ED Provider Notes (Signed)
 EMERGENCY DEPARTMENT HISTORY AND PHYSICAL EXAM      Patient Name: Mitchell Mitchell  MRN: [id number]  Birth Date: 04-Feb-1966  Provider: Schuyler JONELLE Hutching, DO  PCP: No primary care provider on file.     History of Presenting Illness     Chief Complaint   Patient presents with    Pulmonary Embolism       History Provided By: Patient and Patient's Wife     HPI: Mitchell Mitchell, 58 y.o. male  presents to the ED with cc of PE. Patient is very pleasant, history of CKD, HTN, DM - had a CTA chest done today in [facility] which showed pulmonary embolism.  He was told to present immediately to the ER.  He was seen recently for allergic reaction and started on steroids.  Ever since then he had been having difficulty with his blood sugars and subsequently developed some shortness of breath which prompted the CTA.  Per chart patient had a RLE duplex US , had leg pain. Was read that same day and was positive for thrombus.  Per patient and wife, they were never called with results.  Patient developed some right-sided chest pain and difficulty breathing with exertion, he preaches at a church.  He denies chest pain but has some exertional shortness of breath when walking around.  No numbness, tingling, headache, neck or back pain.  No dysuria, abdominal pain, nausea, vomiting, diarrhea.  Is not currently on blood thinners. Had a posterior stroke in 2019, had to be on blood thinners then.    Past History     Past Medical History:  Past Medical History:   Diagnosis Date    CKD (chronic kidney disease) stage 2, GFR 60-89 ml/min     CVA (cerebral vascular accident) (HCC)     Diabetes (HCC)     HTN (hypertension)     HTN (hypertension)        Past Surgical History:  Past Surgical History:   Procedure Laterality Date    BACK SURGERY      TOTAL KNEE ARTHROPLASTY Left        Medications:  No current facility-administered medications for this encounter.     Current Outpatient Medications   Medication Sig Dispense Refill    atorvastatin (LIPITOR) 20 MG tablet 1  tablet Once a day      empagliflozin  (JARDIANCE ) 10 MG tablet       furosemide  (LASIX ) 40 MG tablet Take 1 tablet by mouth daily      hydrALAZINE  (APRESOLINE ) 100 MG tablet Take 0.5 tablets by mouth 2 times daily      labetalol  (NORMODYNE ) 200 MG tablet Take 1 tablet by mouth daily      montelukast  (SINGULAIR ) 10 MG tablet Take 1 tablet by mouth daily      insulin glargine (LANTUS) 100 UNIT/ML injection vial Inject 18 Units into the skin 2 times daily      gabapentin  (NEURONTIN ) 100 MG capsule Take by mouth in the morning and at bedtime. Unknown      insulin regular (HUMULIN R;NOVOLIN R) 100 UNIT/ML injection Inject into the skin See Admin Instructions Sliding scale      albuterol (ACCUNEB) 1.25 MG/3ML nebulizer solution Inhale 3 mLs into the lungs      albuterol sulfate HFA (PROVENTIL;VENTOLIN;PROAIR) 108 (90 Base) MCG/ACT inhaler Inhale 2 puffs into the lungs      aspirin  81 MG chewable tablet Take 1 tablet by mouth daily      glipiZIDE (GLUCOTROL) 5 MG tablet  Take 1 tablet by mouth daily      clotrimazole -betamethasone  (LOTRISONE ) 1-0.05 % cream Apply topically 2 times daily. 1 each 0       Social History:  Social History     Tobacco Use    Smoking status: Never    Smokeless tobacco: Never   Vaping Use    Vaping status: Never Used   Substance Use Topics    Alcohol use: Not Currently    Drug use: Not Currently       Allergies:  Allergies   Allergen Reactions    Latex Hives    Shellfish-Derived Products Angioedema    Pcn [Penicillins] Hives       All the above components of the past  history are auto-populated from the electronic record.  They have been reviewed and the patient has been interviewed for any pertinent past history that pertains to the patient's chief complaint and reason for visit.  Not all pre-populated components may be accurate at the time this note was generated.    Review of Systems   Review of Systems   All other systems reviewed and are negative.      Physical Exam   Physical Exam  Vitals and  nursing note reviewed.   Constitutional:       General: He is not in acute distress.     Appearance: Normal appearance. He is obese. He is not ill-appearing or toxic-appearing.   HENT:      Head: Normocephalic and atraumatic.      Nose: Nose normal.      Mouth/Throat:      Mouth: Mucous membranes are moist.      Pharynx: Oropharynx is clear.   Eyes:      Extraocular Movements: Extraocular movements intact.      Conjunctiva/sclera: Conjunctivae normal.      Pupils: Pupils are equal, round, and reactive to light.   Cardiovascular:      Rate and Rhythm: Normal rate and regular rhythm.      Pulses: Normal pulses.      Heart sounds: Normal heart sounds.   Pulmonary:      Effort: Pulmonary effort is normal.      Breath sounds: Normal breath sounds.   Abdominal:      General: Bowel sounds are normal. There is no distension.      Palpations: Abdomen is soft.      Tenderness: There is no abdominal tenderness. There is no guarding.   Musculoskeletal:         General: Normal range of motion.      Cervical back: Normal range of motion and neck supple. No rigidity or tenderness.      Comments: Right posterior distal lower extremity varicosities with some minimal tenderness in the calf.   Lymphadenopathy:      Cervical: No cervical adenopathy.   Skin:     General: Skin is warm.      Capillary Refill: Capillary refill takes less than 2 seconds.   Neurological:      General: No focal deficit present.      Mental Status: He is alert and oriented to person, place, and time.      Cranial Nerves: No cranial nerve deficit.      Sensory: No sensory deficit.      Motor: No weakness.      Coordination: Coordination normal.   Psychiatric:         Mood and Affect: Mood normal.  Behavior: Behavior normal.         Diagnostic Study Results     Labs -     Recent Results (from the past 24 hours)   CBC with Auto Differential    Collection Time: 12/20/23 10:30 PM   Result Value Ref Range    WBC 5.4 4.6 - 13.2 K/uL    RBC 4.88 4.35 - 5.65  M/uL    Hemoglobin 13.5 13.0 - 16.0 g/dL    Hematocrit 59.9 63.9 - 48.0 %    MCV 82.0 78.0 - 100.0 FL    MCH 27.7 24.0 - 34.0 PG    MCHC 33.8 31.0 - 37.0 g/dL    RDW 86.9 88.3 - 85.4 %    Platelets 252 135 - 420 K/uL    MPV 11.3 9.2 - 11.8 FL    Nucleated RBCs 0.0 0.0 PER 100 WBC    nRBC 0.00 0.00 - 0.01 K/uL    Neutrophils % 44.8 40.0 - 73.0 %    Lymphocytes % 40.3 21.0 - 52.0 %    Monocytes % 9.1 3.0 - 10.0 %    Eosinophils % 4.3 0.0 - 5.0 %    Basophils % 1.3 0.0 - 2.0 %    Immature Granulocytes % 0.2 0 - 0.5 %    Neutrophils Absolute 2.40 1.80 - 8.00 K/UL    Lymphocytes Absolute 2.16 0.90 - 3.60 K/UL    Monocytes Absolute 0.49 0.05 - 1.20 K/UL    Eosinophils Absolute 0.23 0.00 - 0.40 K/UL    Basophils Absolute 0.07 0.00 - 0.10 K/UL    Immature Granulocytes Absolute 0.01 0.00 - 0.04 K/UL    Differential Type AUTOMATED     CMP    Collection Time: 12/20/23 10:30 PM   Result Value Ref Range    Sodium 137 136 - 145 mmol/L    Potassium 3.7 3.5 - 5.5 mmol/L    Chloride 101 98 - 107 mmol/L    CO2 26 21 - 32 mmol/L    Anion Gap 10 mmol/L    Glucose 153 (H) 74 - 108 mg/dL    BUN 22 6 - 23 mg/dL    Creatinine 8.74 9.39 - 1.30 mg/dL    BUN/Creatinine Ratio 17      Est, Glom Filt Rate 68 ml/min/1.46m2    Calcium 9.3 8.5 - 10.1 mg/dL    Total Bilirubin 0.3 0.2 - 1.0 mg/dL    AST 18 10 - 38 U/L    ALT 20 10 - 50 U/L    Alk Phosphatase 99 45 - 117 U/L    Total Protein 7.0 6.4 - 8.2 g/dL    Albumin 3.7 3.4 - 5.0 g/dL    Globulin 3.3 g/dL    Albumin/Globulin Ratio 1.1     Lipase    Collection Time: 12/20/23 10:30 PM   Result Value Ref Range    Lipase 22 13 - 75 U/L   Magnesium    Collection Time: 12/20/23 10:30 PM   Result Value Ref Range    Magnesium 1.8 1.6 - 2.6 mg/dL   TSH    Collection Time: 12/20/23 10:30 PM   Result Value Ref Range    TSH, 3rd Generation 2.230 0.270 - 4.200 uIU/mL   Troponin    Collection Time: 12/20/23 10:30 PM   Result Value Ref Range    Troponin T 40.1 (H) 0 - 22 ng/L   BNP    Collection Time: 12/20/23  10:30 PM   Result Value  Ref Range    NT Pro-BNP 75 36 - 900 pg/mL   Protime-INR    Collection Time: 12/20/23 10:30 PM   Result Value Ref Range    Protime 13.5 12.0 - 15.1 sec    INR 1.0 0.9 - 1.2     Troponin    Collection Time: 12/20/23 11:48 PM   Result Value Ref Range    Troponin T 47.1 (H) 0 - 22 ng/L       Radiologic Studies -   No orders to display           Medical Decision Making     I reviewed the vital signs, available nursing notes, past medical history, past surgical history, family history and social history.    Vital Signs-I have reviewed the vital signs that have been made available during the patient's emergency department visit.  The vital signs auto-populated below are obtained mostly by electronic means through monitoring devices that have been downloaded into the patient's chart by the nursing staff.  Some vital signs are not downloaded into the chart until after the patient has been discharged and this note has been completed, therefore some vital signs may not be available to the physician for review prior to patient's discharge or admission.  The physician has reviewed the patient's triage vital signs, monitored the electronic monitoring devices remotely for any significant vital sign abnormalities, and have reviewed vital signs prior to discharge.  Some vital signs reviewed at bedside or remotely utilizing electronic monitoring devices may be different than the vital signs downloaded into the electronic medical record.  Some vital signs may be erroneous and inaccurate since they are obtained by electronic monitoring devices, and not all vital signs are verified for accuracy by nursing staff prior to downloading into the patient's chart.    Vitals:    12/20/23 2327 12/20/23 2357 12/21/23 0012 12/21/23 0030   BP: (!) 144/81 135/87  (!) 147/78   Pulse:   72 66   Resp:   16 14   Temp:       TempSrc:       SpO2: 94% 96% 97% 98%   Weight:       Height:             Records Reviewed: Nursing notes  for today's visit have been reviewed.  I have also reviewed most recent medical records pertinent to today's complaints, if available in our medical record system.  I have also reviewed all labs and imaging results from previous results in comparison to results obtained today.  If an EKG was obtained today, it has been compared to previous EKGs, if available.  If arriving via EMS, the EMS report has been reviewed if made available to us  within the patient's time in the emergency department.      MDM  Imaging Results - CTA Chest Pulmonary Embolism With IV Contrast (PE) (12/20/2023 3:45 PM EDT)  Impressions   12/20/2023 4:08 PM EDT   1. Small segmental and subsegmental emboli at the right base as  discussed along with segmental embolus posteriorly in the right  upper lobe. No significant central emboli. No evidence of right  heart strain.  2. Atelectasis at the left base with some pleural thickening out  laterally. Lungs otherwise clear.       ED Course as of 12/21/23 0106   Tue Dec 20, 2023   2349 Creatinine: 1.25 [OM]   2349 Bun/Cre: 17 [OM]   2349 Est, Glom Filt  Rate: 68 [OM]   2351 12/05/23 LE US  RIGHT:  IMPRESSION:  Long segment superficial lesser saphenous vein thrombus present at  the knee. Otherwise negative right lower extremity venous sonogram;  no evidence of thrombus in the deep venous system.    [OM]   2351 HAS-BLED Score for Major Bleeding Risk from [email or web address]  on 12/20/2023  ** All calculations should be rechecked by clinician prior to use **    RESULT SUMMARY:  1 points  Risk was 3.4% in one validation study (Lip 2011) and 1.02 bleeds per 100 patient-years in another validation study (Pisters 2010).    Anticoagulation should be considered: Patient has a relatively low risk for major bleeding (~1/100 patient-years).      INPUTS:  Hypertension -> 0 = No  Renal disease -> 0 = No  Liver disease -> 0 = No  Stroke history -> 1 = Yes  Prior major bleeding or predisposition to bleeding -> 0 = No  Labile INR -> 0  = No  Age >65 -> 0 = No  Medication usage predisposing to bleeding -> 0 = No  Alcohol use -> 0 = No   [OM]   2353 TODAY I, Skyley Grandmaison, D.O., PERSONALLY VISUALIZED THE PATIENT'S EKG TRACING.  I AM THE PRIMARY INTERPRETER.  Rate 57, PR 172, QT 442, QTc 430.  Sinus bradycardia with Q waves in lead III, V1, V2. No ST elevation or depression. No STEMI. [OM]   Wed Dec 21, 2023   0019 Imaging Results - CTA Chest Pulmonary Embolism With IV Contrast (PE) (12/20/2023 3:45 PM EDT)  Impressions  12/20/2023 4:08 PM EDT   1. Small segmental and subsegmental emboli at the right base as  discussed along with segmental embolus posteriorly in the right  upper lobe. No significant central emboli. No evidence of right  heart strain.  2. Atelectasis at the left base with some pleural thickening out  laterally. Lungs otherwise clear.     [OM]   0026 Troponin T(!): 47.1 [OM]   0100 Per uptodate algorithm, starting patient on Lovenox  1mg /kg subq [OM]      ED Course User Index  [OM] Rober Schuyler SAUNDERS, DO       Orders Placed This Encounter   Procedures    CBC with Auto Differential    CMP    Lipase    Magnesium    TSH    Troponin    BNP    Protime-INR    Troponin    Troponin    CBC with Auto Differential    Renal Function Panel    Magnesium    Telemetry Monitoring - 24 Hours    EKG 12 Lead (SOB)    Place in Observation Service       Procedures        DIAGNOSIS:   1. Multiple subsegmental pulmonary emboli without acute cor pulmonale (HCC)    2. Deep venous thrombosis (DVT) of right peroneal vein, unspecified chronicity (HCC)        PATIENT REFERRED TO:  No follow-up provider specified.    DISCHARGE MEDICATIONS:  New Prescriptions    No medications on file       DISCONTINUED MEDICATIONS:  Discontinued Medications    No medications on file                DIAGNOSIS:   1. Multiple subsegmental pulmonary emboli without acute cor pulmonale (HCC)    2. Deep venous thrombosis (DVT) of right peroneal vein, unspecified  chronicity (HCC)         DISPOSITION Admitted 12/21/2023 12:32:12 AM               PATIENT REFERRED TO:  No follow-up provider specified.          Schuyler JONELLE Hutching, DO (electronically signed)    I Schuyler Bartley Hutching, DO am the primary clinician of record.    Dragon Disclaimer     Please note that this dictation was completed with Dragon, the [employer] account planner.  Quite often unanticipated grammatical, syntax, homophones, and other interpretive errors are inadvertently transcribed by the computer software.  Please disregard these errors.  Please excuse any errors that have escaped final proofreading.    Dr. Schuyler Bartley Hutching, DO  (Electronically signed)    (Please note that portions of this note were completed with a voice recognition program.  Efforts were made to edit the dictations but occasionally words are mis-transcribed.)       Hutching Schuyler JONELLE, DO  12/21/23 0106

## 2023-12-21 ENCOUNTER — Inpatient Hospital Stay
Admission: EM | Admit: 2023-12-21 | Discharge: 2023-12-21 | Disposition: A | Discharge status: Home / Self Care | Payer: MEDICARE | Admitting: Hospitalist

## 2023-12-21 ENCOUNTER — Observation Stay: Admit: 2023-12-21 | Payer: MEDICARE

## 2023-12-21 LAB — PROTIME-INR
INR: 1 (ref 0.9–1.2)
Protime: 13.5 s (ref 12.0–15.1)

## 2023-12-21 LAB — ECHO (TTE) COMPLETE (PRN CONTRAST/BUBBLE/STRAIN/3D)
AV Area by Peak Velocity: 3.7 cm2
AV Area by VTI: 3 cm2
AV Mean Gradient: 4 mmHg
AV Mean Velocity: 0.9 m/s
AV Peak Gradient: 9 mmHg
AV Peak Velocity: 1.5 m/s
AV VTI: 34.5 cm
AV Velocity Ratio: 0.87
AVA/BSA Peak Velocity: 1.4 cm2/m2
AVA/BSA VTI: 1.1 cm2/m2
Ao Root Index: 1.47 cm/m2
Aortic Root: 4 cm
Ascending Aorta Index: 1.39 cm/m2
Ascending Aorta: 3.8 cm
Body Surface Area: 2.8 m2
E/E' Lateral: 6.23
E/E' Ratio (Averaged): 7.71
E/E' Septal: 9.19
EF Physician: 60 %
Est. RA Pressure: 8 mmHg
Fractional Shortening 2D: 32 % (ref 28–44)
IVC Proxmal: 2.5 cm
IVSd: 1 cm (ref 0.6–1.0)
LA Area 2C: 20.2 cm2
LA Area 4C: 18.6 cm2
LA Diameter: 3.5 cm
LA Major Axis: 5 cm
LA Minor Axis: 5.8 cm
LA Size Index: 1.28 cm/m2
LA Volume BP: 61 mL — AB (ref 18–58)
LA Volume Index BP: 22 mL/m2 (ref 16–34)
LA Volume Index MOD A2C: 22 mL/m2 (ref 16–34)
LA Volume Index MOD A4C: 21 mL/m2 (ref 16–34)
LA Volume MOD A2C: 59 mL — AB (ref 18–58)
LA Volume MOD A4C: 56 mL (ref 18–58)
LA/AO Root Ratio: 0.88
LV E' Lateral Velocity: 10.6 cm/s
LV E' Septal Velocity: 7.18 cm/s
LV EDV A2C: 33 mL
LV EDV A4C: 77 mL
LV EDV Index A2C: 12 mL/m2
LV EDV Index A4C: 28 mL/m2
LV ESV A2C: 12 mL
LV ESV A4C: 28 mL
LV ESV Index A2C: 4 mL/m2
LV ESV Index A4C: 10 mL/m2
LV Ejection Fraction A2C: 62 %
LV Ejection Fraction A4C: 64 %
LV Mass 2D Index: 64.4 g/m2 (ref 49–115)
LV Mass 2D: 175.8 g (ref 88–224)
LV RWT Ratio: 0.47
LVIDd Index: 1.72 cm/m2
LVIDd: 4.7 cm (ref 4.2–5.9)
LVIDs Index: 1.17 cm/m2
LVIDs: 3.2 cm
LVOT Area: 4.2 cm2
LVOT Diameter: 2.3 cm
LVOT Mean Gradient: 3 mmHg
LVOT Peak Gradient: 7 mmHg
LVOT Peak Velocity: 1.3 m/s
LVOT SV: 103.8 mL
LVOT Stroke Volume Index: 38 mL/m2
LVOT VTI: 25 cm
LVOT:AV VTI Index: 0.72
LVPWd: 1.1 cm — AB (ref 0.6–1.0)
MV A Velocity: 0.59 m/s
MV Area by VTI: 3.2 cm2
MV E Velocity: 0.66 m/s
MV E Wave Deceleration Time: 350 ms
MV E/A: 1.12
MV Max Velocity: 0.7 m/s
MV Mean Gradient: 1 mmHg
MV Mean Velocity: 0.4 m/s
MV Peak Gradient: 2 mmHg
MV VTI: 32.6 cm
MV:LVOT VTI Index: 1.3
Main Pulmonary Artery Diameter: 2.1 cm
PV Max Velocity: 1 m/s
PV Peak Gradient: 4 mmHg
RA Area 4C: 18.9 cm2
RA Volume Index A4C: 22 mL/m2
RA Volume: 60 mL
RV Basal Dimension: 3.8 cm
RV GLS: -27.1 %
RV Longitudinal Dimension: 7.6 cm
RV Mid Dimension: 2.5 cm
TAPSE: 2.1 cm (ref 1.7–?)

## 2023-12-21 LAB — BASIC METABOLIC PANEL
Anion Gap: 13 mmol/L
BUN/Creatinine Ratio: 20
BUN: 23 mg/dL (ref 6–23)
CO2: 25 mmol/L (ref 21–32)
Calcium: 9.3 mg/dL (ref 8.5–10.1)
Chloride: 103 mmol/L (ref 98–107)
Creatinine: 1.17 mg/dL (ref 0.60–1.30)
Est, Glom Filt Rate: 73 ml/min/1.73m2
Glucose: 271 mg/dL — ABNORMAL HIGH (ref 74–108)
Potassium: 3.9 mmol/L (ref 3.5–5.5)
Sodium: 140 mmol/L (ref 136–145)

## 2023-12-21 LAB — CBC WITH AUTO DIFFERENTIAL
Basophils %: 1.3 % (ref 0.0–2.0)
Basophils %: 1.1 % (ref 0.0–2.0)
Basophils Absolute: 0.07 K/UL (ref 0.00–0.10)
Basophils Absolute: 0.05 K/UL (ref 0.00–0.10)
Eosinophils %: 4.3 % (ref 0.0–5.0)
Eosinophils %: 4.6 % (ref 0.0–5.0)
Eosinophils Absolute: 0.23 K/UL (ref 0.00–0.40)
Eosinophils Absolute: 0.21 K/UL (ref 0.00–0.40)
Hematocrit: 40 % (ref 36.0–48.0)
Hematocrit: 39.2 % (ref 36.0–48.0)
Hemoglobin: 13.5 g/dL (ref 13.0–16.0)
Hemoglobin: 13 g/dL (ref 13.0–16.0)
Immature Granulocytes %: 0.2 % (ref 0–0.5)
Immature Granulocytes %: 0.2 % (ref 0–0.5)
Immature Granulocytes Absolute: 0.01 K/UL (ref 0.00–0.04)
Immature Granulocytes Absolute: 0.01 K/UL (ref 0.00–0.04)
Lymphocytes %: 40.3 % (ref 21.0–52.0)
Lymphocytes %: 46.2 % (ref 21.0–52.0)
Lymphocytes Absolute: 2.16 K/UL (ref 0.90–3.60)
Lymphocytes Absolute: 2.09 K/UL (ref 0.90–3.60)
MCH: 27.7 pg (ref 24.0–34.0)
MCH: 27.4 pg (ref 24.0–34.0)
MCHC: 33.8 g/dL (ref 31.0–37.0)
MCHC: 33.2 g/dL (ref 31.0–37.0)
MCV: 82 FL (ref 78.0–100.0)
MCV: 82.7 FL (ref 78.0–100.0)
MPV: 11.3 FL (ref 9.2–11.8)
MPV: 11.3 FL (ref 9.2–11.8)
Monocytes %: 9.1 % (ref 3.0–10.0)
Monocytes %: 9.5 % (ref 3.0–10.0)
Monocytes Absolute: 0.49 K/UL (ref 0.05–1.20)
Monocytes Absolute: 0.43 K/UL (ref 0.05–1.20)
Neutrophils %: 44.8 % (ref 40.0–73.0)
Neutrophils %: 38.4 % — ABNORMAL LOW (ref 40.0–73.0)
Neutrophils Absolute: 2.4 K/UL (ref 1.80–8.00)
Neutrophils Absolute: 1.73 K/UL — ABNORMAL LOW (ref 1.80–8.00)
Nucleated RBCs: 0 /100{WBCs}
Nucleated RBCs: 0 /100{WBCs}
Platelets: 252 K/uL (ref 135–420)
Platelets: 225 K/uL (ref 135–420)
RBC: 4.88 M/uL (ref 4.35–5.65)
RBC: 4.74 M/uL (ref 4.35–5.65)
RDW: 13 % (ref 11.6–14.5)
RDW: 13 % (ref 11.6–14.5)
WBC: 5.4 K/uL (ref 4.6–13.2)
WBC: 4.5 K/uL — ABNORMAL LOW (ref 4.6–13.2)
nRBC: 0 K/uL (ref 0.00–0.01)
nRBC: 0 K/uL (ref 0.00–0.01)

## 2023-12-21 LAB — HEPATIC FUNCTION PANEL
ALT: 22 U/L (ref 10–50)
AST: 16 U/L (ref 10–38)
Albumin/Globulin Ratio: 1.1
Albumin: 3.5 g/dL (ref 3.4–5.0)
Alk Phosphatase: 95 U/L (ref 45–117)
Bilirubin, Direct: <0.1 mg/dL (ref 0.0–0.2)
Globulin: 3.1 g/dL
Total Bilirubin: 0.2 mg/dL (ref 0.2–1.0)
Total Protein: 6.5 g/dL (ref 6.4–8.2)

## 2023-12-21 LAB — EKG 12-LEAD
Atrial Rate: 57 {beats}/min
P Axis: 12 degrees
P-R Interval: 172 ms
Q-T Interval: 442 ms
QRS Duration: 92 ms
QTc Calculation (Bazett): 430 ms
R Axis: 0 degrees
T Axis: -1 degrees
Ventricular Rate: 57 {beats}/min

## 2023-12-21 LAB — MAGNESIUM
Magnesium: 1.8 mg/dL (ref 1.6–2.6)
Magnesium: 1.9 mg/dL (ref 1.6–2.6)

## 2023-12-21 LAB — RENAL FUNCTION PANEL
Albumin: 3.4 g/dL (ref 3.4–5.0)
Anion Gap: 12 mmol/L
BUN/Creatinine Ratio: 17
BUN: 23 mg/dL (ref 6–23)
CO2: 26 mmol/L (ref 21–32)
Calcium: 9.5 mg/dL (ref 8.5–10.1)
Chloride: 102 mmol/L (ref 98–107)
Creatinine: 1.36 mg/dL — ABNORMAL HIGH (ref 0.60–1.30)
Est, Glom Filt Rate: 61 ml/min/1.73m2
Glucose: 274 mg/dL — ABNORMAL HIGH (ref 74–108)
Phosphorus: 3.7 mg/dL (ref 2.5–4.9)
Potassium: 3.9 mmol/L (ref 3.5–5.5)
Sodium: 140 mmol/L (ref 136–145)

## 2023-12-21 LAB — COMPREHENSIVE METABOLIC PANEL
ALT: 20 U/L (ref 10–50)
AST: 18 U/L (ref 10–38)
Albumin/Globulin Ratio: 1.1
Albumin: 3.7 g/dL (ref 3.4–5.0)
Alk Phosphatase: 99 U/L (ref 45–117)
Anion Gap: 10 mmol/L
BUN/Creatinine Ratio: 17
BUN: 22 mg/dL (ref 6–23)
CO2: 26 mmol/L (ref 21–32)
Calcium: 9.3 mg/dL (ref 8.5–10.1)
Chloride: 101 mmol/L (ref 98–107)
Creatinine: 1.25 mg/dL (ref 0.60–1.30)
Est, Glom Filt Rate: 68 ml/min/1.73m2
Globulin: 3.3 g/dL
Glucose: 153 mg/dL — ABNORMAL HIGH (ref 74–108)
Potassium: 3.7 mmol/L (ref 3.5–5.5)
Sodium: 137 mmol/L (ref 136–145)
Total Bilirubin: 0.3 mg/dL (ref 0.2–1.0)
Total Protein: 7 g/dL (ref 6.4–8.2)

## 2023-12-21 LAB — TROPONIN
Troponin T: 40.1 ng/L — ABNORMAL HIGH (ref 0–22)
Troponin T: 47.1 ng/L — ABNORMAL HIGH (ref 0–22)
Troponin T: 49.6 ng/L — ABNORMAL HIGH (ref 0–22)

## 2023-12-21 LAB — TSH: TSH, 3rd Generation: 2.23 u[IU]/mL (ref 0.270–4.200)

## 2023-12-21 LAB — LIPASE: Lipase: 22 U/L (ref 13–75)

## 2023-12-21 LAB — BRAIN NATRIURETIC PEPTIDE: NT Pro-BNP: 75 pg/mL (ref 36–900)

## 2023-12-21 MED ORDER — NORMAL SALINE FLUSH 0.9 % IV SOLN
0.9 | Freq: Two times a day (BID) | INTRAVENOUS | Status: DC
Start: 2023-12-21 — End: 2023-12-21
  Administered 2023-12-21: 13:00:00 10 mL via INTRAVENOUS

## 2023-12-21 MED ORDER — GABAPENTIN 300 MG PO CAPS
300 | Freq: Two times a day (BID) | ORAL | Status: DC
Start: 2023-12-21 — End: 2023-12-21
  Administered 2023-12-21: 13:00:00 300 mg via ORAL

## 2023-12-21 MED ORDER — ATORVASTATIN CALCIUM 10 MG PO TABS
10 | Freq: Every evening | ORAL | Status: DC
Start: 2023-12-21 — End: 2023-12-21

## 2023-12-21 MED ORDER — ONDANSETRON HCL 4 MG/2ML IJ SOLN
4 | Freq: Four times a day (QID) | INTRAMUSCULAR | Status: DC | PRN
Start: 2023-12-21 — End: 2023-12-21

## 2023-12-21 MED ORDER — ENOXAPARIN SODIUM 40 MG/0.4ML IJ SOSY
40 | Freq: Two times a day (BID) | INTRAMUSCULAR | Status: DC
Start: 2023-12-21 — End: 2023-12-21

## 2023-12-21 MED ORDER — NORMAL SALINE FLUSH 0.9 % IV SOLN
0.9 | INTRAVENOUS | Status: DC | PRN
Start: 2023-12-21 — End: 2023-12-21

## 2023-12-21 MED ORDER — ENOXAPARIN SODIUM 100 MG/ML IJ SOSY
100 | INTRAMUSCULAR | Status: AC
Start: 2023-12-21 — End: 2023-12-21
  Administered 2023-12-21: 05:00:00 140 via SUBCUTANEOUS

## 2023-12-21 MED ORDER — POLYETHYLENE GLYCOL 3350 17 G PO PACK
17 | Freq: Every day | ORAL | Status: DC | PRN
Start: 2023-12-21 — End: 2023-12-21

## 2023-12-21 MED ORDER — HYDRALAZINE HCL 25 MG PO TABS
25 | Freq: Two times a day (BID) | ORAL | Status: DC
Start: 2023-12-21 — End: 2023-12-21
  Administered 2023-12-21: 13:00:00 50 mg via ORAL

## 2023-12-21 MED ORDER — RIVAROXABAN 15 MG PO TABS
15 | Freq: Two times a day (BID) | ORAL | Status: DC
Start: 2023-12-21 — End: 2023-12-21

## 2023-12-21 MED ORDER — ACETAMINOPHEN 325 MG PO TABS
325 | Freq: Four times a day (QID) | ORAL | Status: DC | PRN
Start: 2023-12-21 — End: 2023-12-21

## 2023-12-21 MED ORDER — MONTELUKAST SODIUM 10 MG PO TABS
10 | Freq: Every day | ORAL | Status: DC
Start: 2023-12-21 — End: 2023-12-21
  Administered 2023-12-21: 13:00:00 10 mg via ORAL

## 2023-12-21 MED ORDER — LABETALOL HCL 100 MG PO TABS
100 | Freq: Every day | ORAL | Status: DC
Start: 2023-12-21 — End: 2023-12-21
  Administered 2023-12-21: 13:00:00 200 mg via ORAL

## 2023-12-21 MED ORDER — SODIUM CHLORIDE 0.9 % IV SOLN
0.9 | INTRAVENOUS | Status: DC | PRN
Start: 2023-12-21 — End: 2023-12-21

## 2023-12-21 MED ORDER — ASPIRIN 81 MG PO CHEW
81 | Freq: Every day | ORAL | Status: DC
Start: 2023-12-21 — End: 2023-12-21
  Administered 2023-12-21: 13:00:00 81 mg via ORAL

## 2023-12-21 MED ORDER — ACETAMINOPHEN 650 MG RE SUPP
650 | Freq: Four times a day (QID) | RECTAL | Status: DC | PRN
Start: 2023-12-21 — End: 2023-12-21

## 2023-12-21 MED ORDER — EMPAGLIFLOZIN 10 MG PO TABS
10 | Freq: Every day | ORAL | Status: DC
Start: 2023-12-21 — End: 2023-12-21
  Administered 2023-12-21: 13:00:00 10 mg via ORAL

## 2023-12-21 MED ORDER — RIVAROXABAN 15 & 20 MG PO TBPK
15 | ORAL | 0 refills | Status: AC
Start: 2023-12-21 — End: ?

## 2023-12-21 MED ORDER — ENOXAPARIN SODIUM 40 MG/0.4ML IJ SOSY
40 | Freq: Once | INTRAMUSCULAR | Status: AC
Start: 2023-12-21 — End: 2023-12-21

## 2023-12-21 MED ORDER — ONDANSETRON 4 MG PO TBDP
4 | Freq: Three times a day (TID) | ORAL | Status: DC | PRN
Start: 2023-12-21 — End: 2023-12-21

## 2023-12-21 MED ORDER — FUROSEMIDE 40 MG PO TABS
40 | Freq: Every day | ORAL | Status: DC
Start: 2023-12-21 — End: 2023-12-21
  Administered 2023-12-21: 13:00:00 40 mg via ORAL

## 2023-12-21 MED FILL — JARDIANCE 10 MG PO TABS: 10 mg | ORAL | Qty: 1

## 2023-12-21 MED FILL — SODIUM CHLORIDE FLUSH 0.9 % IV SOLN: 0.9 % | INTRAVENOUS | Qty: 40

## 2023-12-21 MED FILL — LABETALOL HCL 100 MG PO TABS: 100 mg | ORAL | Qty: 2

## 2023-12-21 MED FILL — ASPIRIN LOW DOSE 81 MG PO CHEW: 81 mg | ORAL | Qty: 1

## 2023-12-21 MED FILL — FUROSEMIDE 40 MG PO TABS: 40 mg | ORAL | Qty: 1

## 2023-12-21 MED FILL — HYDRALAZINE HCL 25 MG PO TABS: 25 mg | ORAL | Qty: 2

## 2023-12-21 MED FILL — MONTELUKAST SODIUM 10 MG PO TABS: 10 mg | ORAL | Qty: 1

## 2023-12-21 MED FILL — GABAPENTIN 300 MG PO CAPS: 300 mg | ORAL | Qty: 1

## 2023-12-21 MED FILL — ENOXAPARIN SODIUM 40 MG/0.4ML IJ SOSY: 40 MG/0.4ML | INTRAMUSCULAR | Qty: 0.4

## 2023-12-21 MED FILL — ENOXAPARIN SODIUM 100 MG/ML IJ SOSY: 100 mg/mL | INTRAMUSCULAR | Qty: 1

## 2023-12-21 NOTE — Consults (Cosign Needed)
 CARDIOLOGY CONSULTATION    REASON FOR CONSULT: Elevated troponin    REQUESTING PROVIDER: Flowers, Leslee, APRN - CNP     CHIEF COMPLAINT: Abnormal CTA    HISTORY OF PRESENT ILLNESS:  Mitchell Mitchell is a 58 y.o. year-old male with past medical history significant for essential hypertension, CKD stage II, history of CVA, type 2 diabetes mellitus, essential hypertension, and dyslipidemia, who was evaluated today due to elevated troponin in the setting of pulmonary embolism.  Patient presented to the ED after being notified by PCP of CTA positive for pulmonary embolism and abnormal vascular duplex. Patient states that he has been having shortness of breath and sharp intermittent right-sided chest pain that started the first week of June.  States that he has recently relocated to the state of [place]  however he commutes back and forth to [place]  quite frequently. 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  Denies any recent illness, fever or chills.  He currently denies any chest pain.  No shortness of breath at rest.  Endorses DOE that recovers with rest.  Trace peripheral edema.    Records from hospital admission course thus far reviewed.      Telemetry reviewed.  No acute events overnight. SB/SR .    INPATIENT MEDICATIONS:  Home medications reviewed.    Current Facility-Administered Medications:     aspirin  chewable tablet 81 mg, 81 mg, Oral, Daily, Flowers, Tanisha, APRN - CNP, 81 mg at 12/21/23 0835    atorvastatin (LIPITOR) tablet 20 mg, 20 mg, Oral, Nightly, Flowers, Nurse, adult, APRN - CNP    furosemide  (LASIX ) tablet 40 mg, 40 mg, Oral, Daily, Flowers, Tanisha, APRN - CNP, 40 mg at 12/21/23 9164    gabapentin  (NEURONTIN ) capsule 300 mg, 300 mg, Oral, BID, Flowers, Tanisha, APRN - CNP, 300 mg at 12/21/23 9165    hydrALAZINE  (APRESOLINE ) tablet 50 mg, 50 mg, Oral, BID, Flowers, Tanisha, APRN - CNP, 50 mg at 12/21/23 9165    labetalol  (NORMODYNE ) tablet 200 mg, 200 mg, Oral, Daily, Flowers, Tanisha, APRN - CNP, 200 mg at  12/21/23 9164    montelukast  (SINGULAIR ) tablet 10 mg, 10 mg, Oral, Daily, Flowers, Tanisha, APRN - CNP, 10 mg at 12/21/23 9164    sodium chloride  flush 0.9 % injection 5-40 mL, 5-40 mL, IntraVENous, 2 times per day, Flowers, Leslee, APRN - CNP, 10 mL at 12/21/23 9162    sodium chloride  flush 0.9 % injection 5-40 mL, 5-40 mL, IntraVENous, PRN, Flowers, Tanisha, APRN - CNP    0.9 % sodium chloride  infusion, , IntraVENous, PRN, Flowers, Leslee, APRN - CNP    ondansetron (ZOFRAN-ODT) disintegrating tablet 4 mg, 4 mg, Oral, Q8H PRN **OR** ondansetron (ZOFRAN) injection 4 mg, 4 mg, IntraVENous, Q6H PRN, Flowers, Tanisha, APRN - CNP    polyethylene glycol (GLYCOLAX) packet 17 g, 17 g, Oral, Daily PRN, Flowers, Tanisha, APRN - CNP    acetaminophen (TYLENOL) tablet 650 mg, 650 mg, Oral, Q6H PRN **OR** acetaminophen (TYLENOL) suppository 650 mg, 650 mg, Rectal, Q6H PRN, Flowers, Tanisha, APRN - CNP    empagliflozin  (JARDIANCE ) tablet 10 mg, 10 mg, Oral, Daily, Flowers, Tanisha, APRN - CNP, 10 mg at 12/21/23 9164    rivaroxaban (XARELTO) tablet 15 mg, 15 mg, Oral, BID WC, Flowers, Leslee, APRN - CNP     ALLERGIES:  Allergies reviewed with the patient,  Allergies   Allergen Reactions    Latex Hives    Shellfish-Derived Products Angioedema    Pcn [Penicillins] Hives    .  FAMILY HISTORY:  Family history reviewed.     SOCIAL HISTORY:  Notable for no tobacco use, no heavy alcohol or illicit drug use.      REVIEW OF SYSTEMS:  Complete review of systems performed, pertinents noted above, all other systems are negative.    PHYSICAL EXAMINATION:    General: Alert and oriented x 4.  NAD.  HOH.  Cardiovascular: Rate and rhythm regular, normal S1-S2, no murmur, rubs or gallops.  No JVD.  Respiratory:  Lungs are clear bilaterally.  No wheezing or rales.  Abdomen: Soft, nontender. + BS  Extremities: Trace lower extremity edema  Skin:  Dry, warm  Psych:  Normal affect      Vitals:    12/21/23 0922   BP:    Pulse: 62   Resp: 18    Temp:    SpO2: 97%       Recent labs results and imaging reviewed.     Discussed case with Dr. Caleen and our impression and recommendations are as follows:    Elevated Troponin: SOB with right side chest pain since June.   Troponin T 40.1->47.1-> 49.6, flat non ACS trended suspect reactive to PE   CTA + PE   EKG SB  with no overt ischemia  Echo pending   Consider OP cardiac stress testing.  He is agreeable to establish care with [facility] outpatient.  He will need follow-up appointment prior to discharge    Pulmonary embolism:   CTA showing small segmental and subsegmental emboli at the right base with segmental embolus posteriorly in the right upper lobe. No significant central emboli. No evidence of right   heart strain.   PVL showing long segment superficial lesser saphenous vein thrombus present at   the knee. No deep vein thrombus noted.   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    Essential hypertension: BP at goal   Continue labetalol  and hydralazine   Echo pending    Hyperlipidemia  Continue atorvastatin    CKD  Crt. 1.25  Avoiding nephrotoxic agents    DM type II-primary managing  History of CVA-continue ASA and statin therapy    Spent 40 minutes reviewing external notes, history, completing exam, and treatment plan.     Thank you for involving us  in the care of this patient. The patient will need a 2 week follow up at [facility]  Cardiology upon discharge.   Please do not hesitate to call me or Dr. Caleen if additional questions arise.    [name], APRN - CNP  12/21/2023

## 2023-12-21 NOTE — H&P (Cosign Needed)
 Admission History and Physical              NAME: Mitchell Mitchell   DOB:  1966/03/11   MRN:  [id number]     Date/Time:  12/21/2023 1:44 AM    Patient PCP: No primary care provider on file.  _____________________________________________________________________________    Given the patient's current clinical presentation, I have a high level of concern for decompensation if discharged from the emergency department.  Complex decision making was performed, which includes reviewing the patient's available past medical records, laboratory results, and x-ray films.       My assessment of this patient's clinical condition and my plan of care is as follows:    Assessment / Plan:  PE right lower lobe/DVT RLE, POA  --PVL: Long segment superficial lesser saphenous vein thrombus present at the knee. Otherwise negative right lower extremity venous sonogram;   no evidence of thrombus in the deep venous system.  --CTA chest: small segmental and subsegmental emboli at the right base as  discussed along with segmental embolus posteriorly in the right upper lobe. No significant central emboli. No evidence of right heart strain. 2. Atelectasis at the left base with some pleural thickening out  laterally. Lungs otherwise clear.  --started Lovenox  1 mg/kg BID. Transition to xarelto in am  --daily PT/INR  --CBC in the am  --ECHO in the am    Elevated troponin, Type II likely: due to PE  --trop 40.1-->47.1, currently flat, denies acute chest pain   --EKG: SB w/septal and inferior infarct, LVH  --Cardiology consult    Hypertension:  --Chronic currently stable  --Continue hydralazine , labetalol   --Monitor BP    Chronic kidney disease stage 2  --chronic, stable Cr 1.25  --strict I/O, monitor for fluid overload  --renal panel daily, monitor K+ and Phos levels closely  --Avoid nephrotoxic agents, NSAIDS, ACEI/ARBs, Diuretics, avoid lowering BP drastically    HLD:  --Continue atorvastatin    DM:  --Continue glargine  --POCT glucose AC and HS  with SSI with meals    Hx CVA  --Continue ASA and statin    Obesity  --BMI: 36.27  --adjusting lifestyle modification education    Estimated LOS 1 day    TOTAL TIME:  45 Minutes    Code Status: Full    Prophylaxis:  lovenox      Subjective: HPI     Mitchell Mitchell is a 58 y.o.  African American male who  has a past medical history of CKD (chronic kidney disease) stage 2, GFR 60-89 ml/min, CVA (cerebral vascular accident) (HCC), Diabetes (HCC), HTN (hypertension), and HTN (hypertension) who presents to the ED after being called by primary care physician stating patient had PE found on CTA and right lower extremity DVT.  Patient states having episodes of exertional shortness of breath with right-sided chest pain and leg pain that started around the beginning of June which is when he had his right lower extremity venous duplex. Pt denies fevers, chills, nausea, vomiting, chest pain, constipation, diarrhea, lightheadedness, cough, weakness, fatigue, denies GI/GU symptoms, denies headache. In the ED trop 40.1-->47.1, CBC/CMP unremarkable except with a glucose of 153. Received lovenox  1mg /kg. Pt patient had CTA of the chest done on yesterday in [place]  which revealed CTA chest: small segmental and subsegmental emboli at the right base as discussed along with segmental embolus posteriorly in the right  upper lobe. No significant central emboli. No evidence of right heart strain.  2. Atelectasis at the left base with some pleural  thickening out laterally. Lungs otherwise clear.  Apparently 2 weeks ago patient had a PVL of the right lower extremity which revealed Long segment superficial lesser saphenous vein thrombus present at the knee. Otherwise negative right lower extremity venous sonogram; no evidence of thrombus in the deep venous system. Discussed case with ED provider, hospital medicine will admit the patient to observation for further evaluation and treatment PE and DVT. Patient assessed in the ED patient is  alert and oriented, there is no acute distress noted.     History received from patient, family, and EHR    Past Medical History:   Diagnosis Date    CKD (chronic kidney disease) stage 2, GFR 60-89 ml/min     CVA (cerebral vascular accident) (HCC)     Diabetes (HCC)     HTN (hypertension)     HTN (hypertension)       Past Surgical History:   Procedure Laterality Date    BACK SURGERY      TOTAL KNEE ARTHROPLASTY Left      History reviewed. No pertinent family history.   Social History     Tobacco Use    Smoking status: Never    Smokeless tobacco: Never   Substance Use Topics    Alcohol use: Not Currently       Prior to Admission medications    Medication Sig Start Date End Date Taking? Authorizing Provider   atorvastatin (LIPITOR) 20 MG tablet 1 tablet Once a day 11/22/23  Yes [provider]   empagliflozin  (JARDIANCE ) 10 MG tablet  05/30/23  Yes [provider]   furosemide  (LASIX ) 40 MG tablet Take 1 tablet by mouth daily 11/22/23 11/21/24 Yes [provider]   hydrALAZINE  (APRESOLINE ) 100 MG tablet Take 0.5 tablets by mouth 2 times daily 11/22/23 11/21/24 Yes [provider]   labetalol  (NORMODYNE ) 200 MG tablet Take 1 tablet by mouth daily 11/22/23  Yes [provider]   montelukast  (SINGULAIR ) 10 MG tablet Take 1 tablet by mouth daily 11/22/23  Yes [provider]   insulin glargine (LANTUS) 100 UNIT/ML injection vial Inject 18 Units into the skin 2 times daily   Yes [provider]   gabapentin  (NEURONTIN ) 100 MG capsule Take by mouth in the morning and at bedtime. Unknown   Yes [provider]   insulin regular (HUMULIN R;NOVOLIN R) 100 UNIT/ML injection Inject into the skin See Admin Instructions Sliding scale   Yes [provider]   albuterol (ACCUNEB) 1.25 MG/3ML nebulizer solution Inhale 3 mLs into the lungs    [provider]   albuterol sulfate HFA (PROVENTIL;VENTOLIN;PROAIR) 108 (90 Base) MCG/ACT inhaler Inhale 2 puffs  into the lungs    [provider]   aspirin  81 MG chewable tablet Take 1 tablet by mouth daily    [provider]   glipiZIDE (GLUCOTROL) 5 MG tablet Take 1 tablet by mouth daily    [provider]   clotrimazole -betamethasone  (LOTRISONE ) 1-0.05 % cream Apply topically 2 times daily. 11/02/23   [place], Fernanda RAMAN, MD       Allergies:   Allergies   Allergen Reactions    Latex Hives    Shellfish-Derived Products Angioedema    Pcn [Penicillins] Hives          REVIEW OF SYSTEMS:    Positive = Red   Negative = Black   General:  fever, chills, sweats, generalized weakness, weight loss/gain,                                       loss of appetite   Eyes:                           blurred vision, eye pain, loss of vision, double vision  ENT:                            rhinorrhea, pharyngitis   Respiratory:               Shortness of breath, cough  Cardiology:                chest pain, palpitations, orthopnea, PND, edema, syncope   Gastrointestinal:       abdominal pain, nausea, vomiting, diarrhea, dysphagia, constipation, bleeding   Genitourinary:           frequency, urgency, dysuria, hematuria, incontinence   Musculoskeletal :      myalgias and joint pain  Hematology:              easy bruising, nose or gum bleeding, lymphadenopathy   Dermatological:         rash, ulceration, pruritis, color change / jaundice  Endocrine:                 hot flashes or polydipsia   Neurological:             headache, dizziness, confusion, focal weakness, paresthesia,                                      Speech difficulties, memory loss, gait difficulty  Psychological:          Feelings of anxiety, depression, agitation      I am not able to complete the review of systems because:   The patient is intubated and sedated    The patient has altered mental status due to his acute medical problems    The patient has baseline aphasia from prior stroke(s)    The patient has baseline dementia and is  not reliable historian    The patient is in acute medical distress and unable to provide information         Objective:     VITALS:    Vitals:    12/21/23 0030   BP: (!) 147/78   Pulse: 66   Resp: 14   Temp:    SpO2: 98%       PHYSICAL EXAM:  Physical Exam    Positive=RED  Constitutional: 58 y.o.  male Alert, cooperative, no distress, appears stated age    HENT: Atraumatic, nose midline, oropharynx clear and moist, trachea midline   Eyes: Conjunctiva normal, PERRL, anicteric sclerae   Skin: Dry, intact, warm, bruise right calf   Cardiovascular: Regular rate and regular rhythm, no murmurs, pulses palpable, no edema   Respiratory: Even and unlabored, Lungs diminished in bases, no wheezes, crackles, rales, or rhonchi   Gastrointestinal: Normal/round, distended, firm, soft and non-tender to palpation, bowel sounds positive x 4 quadrants   Genitourinary: Deferred   Musculoskeletal: Normal ROM, right calf varicosities with mild tenderness   Neurological: Alert and  oriented x 3, awake. No facial droop. No slurred speech. Hand grasps equal. Intact sensations   Psychiatric: Affect normal, Answers questions appropriately       Labs:  Abnormal Labs Reviewed   COMPREHENSIVE METABOLIC PANEL - Abnormal       Result Value    Sodium 137      Potassium 3.7      Chloride 101      CO2 26      Anion Gap 10      Glucose 153 (*)     BUN 22      Creatinine 1.25      BUN/Creatinine Ratio 17      Est, Glom Filt Rate 68      Calcium 9.3      Total Bilirubin 0.3      AST 18      ALT 20      Alk Phosphatase 99      Total Protein 7.0      Albumin 3.7      Globulin 3.3      Albumin/Globulin Ratio 1.1     TROPONIN - Abnormal    Troponin T 40.1 (*)    TROPONIN - Abnormal    Troponin T 47.1 (*)        Imaging:  No orders to display     __________________________________________________  Care Plan discussed with:    Comments   Patient X    Family      RN X    Care Manager                    Consultant:       _______________________________________________________________________  Expected  Disposition:   Home with Family X   HH/PT/OT/RN    SNF/LTC    SAHR    ________________________________________________________________________    Electronically Signed : Leslee Manuel, APRN - [facility] 12/21/2023 1:44 AM    Please note that this dictation was completed with Dragon, the computer voice recognition software.  Quite often unanticipated grammatical, syntax, homophones, and other interpretive errors are inadvertently transcribed by the computer software.  Please disregard these errors.  Please excuse any errors that have escaped final proofreading.  Thank you.

## 2023-12-21 NOTE — Plan of Care (Signed)
 Problem: Pain  Goal: Verbalizes/displays adequate comfort level or baseline comfort level  Outcome: Progressing     Problem: Chronic Conditions and Co-morbidities  Goal: Patient's chronic conditions and co-morbidity symptoms are monitored and maintained or improved  Outcome: Progressing     Problem: Discharge Planning  Goal: Discharge to home or other facility with appropriate resources  Outcome: Progressing     Problem: Safety - Adult  Goal: Free from fall injury  Outcome: Progressing     Problem: ABCDS Injury Assessment  Goal: Absence of physical injury  Outcome: Progressing

## 2023-12-21 NOTE — Progress Notes (Addendum)
 Bedside and Verbal shift change report given to L.Millicent, RN (Cabin crew) by GORMAN. Eveline, RN Physiological scientist). Report included the following information Nurse Handoff Report, Adult Overview, MAR, Recent Results, Cardiac Rhythm  , Neuro Assessment, and Event Log- Assumed care    0730- Echo being performed at pt bedside    0821-Shift assessment completed- Safety measures implemented- Pt displays no signs of distress at this time- Wife at bedside    0835-Medications administered per [facility] protocol- Pt tolerated well- No concerns expressed at this time    1030-Spoke with pt concerning need to make follow up appointment with PCP. Pt states they will not like us  to make appointment at this time, will follow up with a PCP they are comfortable with- List of providers given to pt    1220- Discharge instructions and education completed- Opportunity for questions addressed at this time- Pt acknowledges understanding- IV removed-Pt tolerated well- Wheeled pt down for discharge home- No signs of distress noted at this time

## 2023-12-21 NOTE — Progress Notes (Addendum)
 0120 Patient arrived to room 251 via wheelchair.   Patient is alert and oriented.  IV present in right ac   Telemetry applied  Gown and non slip socks applied  Oriented to room and call bell system    Spouse at bedside  Vitals obtained assessment complete    0403 vitals obtained neurovascular reassessment complete    0710 report given l. Millicent rn

## 2023-12-21 NOTE — Discharge Summary (Signed)
 Discharge Summary       PATIENT ID: Mitchell Mitchell  MRN: [id number]   DATE OF BIRTH: Nov 13, 1965    DATE OF ADMISSION: 12/20/2023  9:52 PM    DATE OF DISCHARGE: 12/21/23    PRIMARY CARE PROVIDER: No primary care provider on file.     ATTENDING PHYSICIAN: Jyll Tomaro, MD  DISCHARGING PROVIDER: Felecia Stanfill, MD        CONSULTATIONS: IP CONSULT TO CARDIOLOGY    PROCEDURES/SURGERIES: * No surgery found *    ADMITTING DIAGNOSES & HOSPITAL COURSE:   Sony Schlarb is a 58 y.o.  African American male who  has a past medical history of CKD (chronic kidney disease) stage 2, GFR 60-89 ml/min, CVA (cerebral vascular accident) (HCC), Diabetes (HCC), HTN (hypertension), and HTN (hypertension) who presents to the ED after being called by primary care physician stating patient had PE found on CTA and right lower extremity DVT.  Patient states having episodes of exertional shortness of breath with right-sided chest pain and leg pain that started around the beginning of June which is when he had his right lower extremity venous duplex. Pt denies fevers, chills, nausea, vomiting, chest pain, constipation, diarrhea, lightheadedness, cough, weakness, fatigue, denies GI/GU symptoms, denies headache. In the ED trop 40.1-->47.1, CBC/CMP unremarkable except with a glucose of 153. Received lovenox  1mg /kg. Pt patient had CTA of the chest done on yesterday in [place]  which revealed CTA chest: small segmental and subsegmental emboli at the right base as discussed along with segmental embolus posteriorly in the right  upper lobe. No significant central emboli. No evidence of right heart strain.  2. Atelectasis at the left base with some pleural thickening out laterally. Lungs otherwise clear.  Apparently 2 weeks ago patient had a PVL of the right lower extremity which revealed Long segment superficial lesser saphenous vein thrombus present at the knee. Otherwise negative right lower extremity venous sonogram; no evidence of thrombus in  the deep venous system. Discussed case with ED provider, hospital medicine will admit the patient to observation for further evaluation and treatment PE and DVT. Patient assessed in the ED patient is alert and oriented, there is no acute distress noted.         DISCHARGE DIAGNOSES / PLAN:      Assessment / Plan:  PE right lower lobe/DVT RLE, POA  --PVL: Long segment superficial lesser saphenous vein thrombus present at the knee. Otherwise negative right lower extremity venous sonogram;   no evidence of thrombus in the deep venous system.  --CTA chest: small segmental and subsegmental emboli at the right base as  discussed along with segmental embolus posteriorly in the right upper lobe. No significant central emboli. No evidence of right heart strain. 2. Atelectasis at the left base with some pleural thickening out  laterally. Lungs otherwise clear.  --started Lovenox  1 mg/kg BID. Transition to xarelto in am  --daily PT/INR  --CBC in the am  --ECHO in the am     Elevated troponin, Type II likely: due to PE  --trop 40.1-->47.1, currently flat, denies acute chest pain   --EKG: SB w/septal and inferior infarct, LVH  --Cardiology consult     Hypertension:  --Chronic currently stable  --Continue hydralazine , labetalol   --Monitor BP     Chronic kidney disease stage 2  --chronic, stable Cr 1.25  --strict I/O, monitor for fluid overload  --renal panel daily, monitor K+ and Phos levels closely  --Avoid nephrotoxic agents, NSAIDS, ACEI/ARBs, Diuretics, avoid lowering BP drastically  HLD:  --Continue atorvastatin     DM:  --Continue glargine  --POCT glucose AC and HS with SSI with meals     Hx CVA  --Continue ASA and statin     Obesity  --BMI: 36.27  --adjusting lifestyle modification education         Day of dc  Pt drives a good bit between [place]  and [place] , but otherwise no precipitating factors noted for dvt/pe.  Start xarelto, pt didn't feel like doing 6 min walk due to leg aching.  Ok for home, needs PCP to  continue workup, suggest referral to hematology.  Place compression stocking to Rle      DISCHARGE MEDICATIONS:     Medication List        START taking these medications      rivaroxaban 15 & 20 MG Starter Pack  Take as directed on package.            CONTINUE taking these medications      * albuterol 1.25 MG/3ML nebulizer solution  Commonly known as: ACCUNEB     * albuterol sulfate HFA 108 (90 Base) MCG/ACT inhaler  Commonly known as: PROVENTIL;VENTOLIN;PROAIR     aspirin  81 MG chewable tablet     atorvastatin 20 MG tablet  Commonly known as: LIPITOR     clotrimazole -betamethasone  1-0.05 % cream  Commonly known as: LOTRISONE   Apply topically 2 times daily.     furosemide  40 MG tablet  Commonly known as: LASIX      gabapentin  100 MG capsule  Commonly known as: NEURONTIN      glipiZIDE 5 MG tablet  Commonly known as: GLUCOTROL     hydrALAZINE  100 MG tablet  Commonly known as: APRESOLINE      insulin glargine 100 UNIT/ML injection vial  Commonly known as: LANTUS     insulin regular 100 UNIT/ML injection  Commonly known as: HumuLIN R;NovoLIN R     Jardiance  10 MG tablet  Generic drug: empagliflozin      labetalol  200 MG tablet  Commonly known as: NORMODYNE      montelukast  10 MG tablet  Commonly known as: SINGULAIR            * This list has 2 medication(s) that are the same as other medications prescribed for you. Read the directions carefully, and ask your doctor or other care provider to review them with you.                   Where to Get Your Medications        These medications were sent to [facility] - [place], [place] - [address] - P [phone] [address] [place] [phone]  [address], [place] [address]      Phone: [phone]   rivaroxaban 15 & 20 MG Starter Pack            NOTIFY YOUR PHYSICIAN FOR ANY OF THE FOLLOWING:   Fever over 101 degrees for 24 hours.   Chest pain, shortness of breath, fever, chills, nausea, vomiting, diarrhea, change in mentation, falling, weakness, bleeding. Severe pain or  pain not relieved by medications.  Or, any other signs or symptoms that you may have questions about.    DISPOSITION:home    Home With:   OT  PT  HH  RN       Long term SNF/Inpatient Rehab    Independent/assisted living    Hospice    Other:       PATIENT CONDITION AT DISCHARGE:  Functional status    Poor     Deconditioned    x Independent      Cognition    x Lucid     Forgetful     Dementia      Catheters/lines (plus indication)    Foley     PICC     PEG    x None      Code status    x Full code     DNR      PHYSICAL EXAMINATION AT DISCHARGE:  Vitals:    12/21/23 0922   BP:    Pulse: 62   Resp: 18   Temp:    SpO2: 97%      General:          Alert, cooperative, no distress, appears stated age.     HEENT:           Atraumatic, anicteric sclerae, pink conjunctivae                          No oral ulcers, mucosa moist, throat clear, dentition fair  Neck:               Supple, symmetrical  Lungs:             Clear to auscultation bilaterally.  No Wheezing or Rhonchi. No rales.  Chest wall:      No tenderness  No Accessory muscle use.  Heart:              Regular  rhythm,  No  murmur   No edema  Abdomen:        Soft, non-tender. Not distended.  Bowel sounds normal  Extremities:     more prominent varicosity on RLE, rle swelling compared to Left  Skin:                Not pale.  Not Jaundiced  No rashes   Psych:             Not anxious or agitated.  Neurologic:      Alert, moves all extremities, answers questions appropriately and responds to commands       CHRONIC MEDICAL DIAGNOSES:      Greater than 45 minutes were spent with the patient on counseling and coordination of care    Signed:   Naira Standiford, MD  12/21/2023  10:03 AM

## 2023-12-21 NOTE — Progress Notes (Signed)
 Pt refused 6 minute walk test due to leg pain. While on RA at rest pt O2 SAT is 97%. RN aware     12/21/23 0922   Oxygen Therapy/Pulse Ox   O2 Therapy Room air   O2 Device None (Room air)   Pulse 62   Respirations 18   SpO2 97 %

## 2023-12-21 NOTE — Plan of Care (Signed)
 Problem: Pain  Goal: Verbalizes/displays adequate comfort level or baseline comfort level  12/21/2023 0840 by Millicent Epps, RN  Outcome: Progressing  12/21/2023 0506 by Eveline Domino, RN  Outcome: Progressing     Problem: Chronic Conditions and Co-morbidities  Goal: Patient's chronic conditions and co-morbidity symptoms are monitored and maintained or improved  12/21/2023 0840 by Millicent Epps, RN  Outcome: Progressing  Flowsheets (Taken 12/21/2023 [phone])  Care Plan - Patient's Chronic Conditions and Co-Morbidity Symptoms are Monitored and Maintained or Improved:   Monitor and assess patient's chronic conditions and comorbid symptoms for stability, deterioration, or improvement   Collaborate with multidisciplinary team to address chronic and comorbid conditions and prevent exacerbation or deterioration   Update acute care plan with appropriate goals if chronic or comorbid symptoms are exacerbated and prevent overall improvement and discharge  12/21/2023 0506 by Eveline Domino, RN  Outcome: Progressing     Problem: Discharge Planning  Goal: Discharge to home or other facility with appropriate resources  12/21/2023 0840 by Millicent Epps, RN  Outcome: Progressing  Flowsheets (Taken 12/21/2023 [phone])  Discharge to home or other facility with appropriate resources:   Identify barriers to discharge with patient and caregiver   Arrange for needed discharge resources and transportation as appropriate   Identify discharge learning needs (meds, wound care, etc)   Arrange for interpreters to assist at discharge as needed   Refer to discharge planning if patient needs post-hospital services based on physician order or complex needs related to functional status, cognitive ability or social support system  12/21/2023 0506 by Eveline Domino, RN  Outcome: Progressing     Problem: Safety - Adult  Goal: Free from fall injury  12/21/2023 0840 by Millicent Epps, RN  Outcome: Progressing  12/21/2023 0506 by Eveline Domino, RN  Outcome:  Progressing     Problem: ABCDS Injury Assessment  Goal: Absence of physical injury  12/21/2023 0840 by Millicent Epps, RN  Outcome: Progressing  12/21/2023 0506 by Eveline Domino, RN  Outcome: Progressing

## 2023-12-21 NOTE — ED Notes (Signed)
 TRANSFER - OUT REPORT:    Verbal report given to Sara,RN on Zafir Schauer  being transferred to [address] [facility] for routine progression of patient care       Report consisted of patient's Situation, Background, Assessment and   Recommendations(SBAR).     Information from the following report(s) ED Encounter Summary, ED SBAR, and MAR was reviewed with the receiving nurse.    Kinder Fall Assessment:    Presents to emergency department  because of falls (Syncope, seizure, or loss of consciousness): No  Age > 70: No  Altered Mental Status, Intoxication with alcohol or substance confusion (Disorientation, impaired judgment, poor safety awaremess, or inability to follow instructions): No  Impaired Mobility: Ambulates or transfers with assistive devices or assistance; Unable to ambulate or transer.: No  Nursing Judgement: No          Lines:   Peripheral IV 12/20/23 Right Antecubital (Active)        Opportunity for questions and clarification was provided.      Patient transported with:  Monitor and Tech

## 2023-12-21 NOTE — Care Coordination-Inpatient (Signed)
 In to see pt and give information for 10$ Xarelto discount, given to wife who is at bedside.

## 2023-12-28 ENCOUNTER — Emergency Department: Admit: 2023-12-28 | Payer: MEDICARE

## 2023-12-28 ENCOUNTER — Inpatient Hospital Stay
Admit: 2023-12-28 | Discharge: 2023-12-28 | Disposition: A | Discharge status: Home / Self Care | Payer: MEDICARE | Arrived: VH | Attending: Emergency Medicine

## 2023-12-28 DIAGNOSIS — S93402A Sprain of unspecified ligament of left ankle, initial encounter: Principal | ICD-10-CM

## 2023-12-28 MED ORDER — ACETAMINOPHEN 500 MG PO TABS
500 | ORAL | Status: AC
Start: 2023-12-28 — End: 2023-12-28
  Administered 2023-12-28: 21:00:00 1000 mg via ORAL

## 2023-12-28 MED FILL — ACETAMINOPHEN EXTRA STRENGTH 500 MG PO TABS: 500 mg | ORAL | Qty: 2

## 2023-12-28 NOTE — ED Triage Notes (Signed)
 Pt reports he missed a step and rolled his left ankle on Sunday, reports continued pain.

## 2023-12-28 NOTE — ED Provider Notes (Addendum)
 [facility] EMERGENCY DEPARTMENT  eMERGENCY dEPARTMENT eNCOUnter        Pt Name: Mitchell Mitchell  MRN: [id number]  Birthdate [birth year]  Date of evaluation: 12/28/2023  Provider: Glendia LELON Pa, MD  PCP: No primary care provider on file.  Note Started: 4:34 PM EDT 12/28/2023          CHIEF COMPLAINT       Chief Complaint   Patient presents with    Ankle Pain       HISTORY OF PRESENT ILLNESS        Patient getting out of bed 3 nights ago when he inverted the left ankle.  Initially had swelling which improved, was hoping it would get better.  Pain in the lateral ankle persists.  Recently admitted for pulmonary emboli, on anticoagulation.  No other injuries.  There is no head trauma or neck pain.  No chest or abdominal symptoms.  No weakness, numbness or tingling in the leg.    Nursing Notes were all reviewed and agreed with or any disagreements were addressed  in the HPI.    REVIEW OF SYSTEMS         The following 7 systems are reviewed and negative except as noted in the HPI: Constitutional, ENT, CV, GI, Pulmonary, GU, and skin       PASTMEDICAL HISTORY     Past Medical History:   Diagnosis Date    CKD (chronic kidney disease) stage 2, GFR 60-89 ml/min     CVA (cerebral vascular accident) (HCC)     Diabetes (HCC)     HTN (hypertension)     HTN (hypertension)          SURGICAL HISTORY       Past Surgical History:   Procedure Laterality Date    BACK SURGERY      TOTAL KNEE ARTHROPLASTY Left          CURRENT MEDICATIONS       Previous Medications    ALBUTEROL (ACCUNEB) 1.25 MG/3ML NEBULIZER SOLUTION    Inhale 3 mLs into the lungs    ALBUTEROL SULFATE HFA (PROVENTIL;VENTOLIN;PROAIR) 108 (90 BASE) MCG/ACT INHALER    Inhale 2 puffs into the lungs    ASPIRIN  81 MG CHEWABLE TABLET    Take 1 tablet by mouth daily    ATORVASTATIN  (LIPITOR) 20 MG TABLET    1 tablet Once a day    CLOTRIMAZOLE -BETAMETHASONE  (LOTRISONE ) 1-0.05 % CREAM    Apply topically 2 times daily.    EMPAGLIFLOZIN  (JARDIANCE ) 10 MG TABLET         FUROSEMIDE  (LASIX ) 40 MG TABLET    Take 1 tablet by mouth daily    GABAPENTIN  (NEURONTIN ) 100 MG CAPSULE    Take by mouth in the morning and at bedtime. Unknown    GLIPIZIDE (GLUCOTROL) 5 MG TABLET    Take 1 tablet by mouth daily    HYDRALAZINE  (APRESOLINE ) 100 MG TABLET    Take 0.5 tablets by mouth 2 times daily    INSULIN GLARGINE (LANTUS) 100 UNIT/ML INJECTION VIAL    Inject 18 Units into the skin 2 times daily    INSULIN REGULAR (HUMULIN R;NOVOLIN R) 100 UNIT/ML INJECTION    Inject into the skin See Admin Instructions Sliding scale    LABETALOL  (NORMODYNE ) 200 MG TABLET    Take 1 tablet by mouth daily    MONTELUKAST  (SINGULAIR ) 10 MG TABLET    Take 1 tablet by mouth daily    RIVAROXABAN  15 & 20  MG STARTER PACK    Take as directed on package.       ALLERGIES     Latex, Shellfish allergy, Shellfish-derived products, and Penicillins    FAMILY HISTORY     History reviewed. No pertinent family history.       SOCIAL HISTORY       Social History     Socioeconomic History    Marital status: Married     Spouse name: None    Number of children: None    Years of education: None    Highest education level: None   Tobacco Use    Smoking status: Never    Smokeless tobacco: Never   Vaping Use    Vaping status: Never Used   Substance and Sexual Activity    Alcohol use: Not Currently    Drug use: Not Currently     Social Drivers of Health     Food Insecurity: No Food Insecurity (12/21/2023)    Hunger Vital Sign     Worried About Running Out of Food in the Last Year: Never true     Ran Out of Food in the Last Year: Never true   Transportation Needs: No Transportation Needs (12/21/2023)    PRAPARE - Therapist, art (Medical): No     Lack of Transportation (Non-Medical): No   Housing Stability: Low Risk  (12/21/2023)    Housing Stability Vital Sign     Unable to Pay for Housing in the Last Year: No     Number of Times Moved in the Last Year: 1     Homeless in the Last Year: No       SCREENINGS         Glasgow Coma Scale  Eye Opening: Spontaneous  Best Verbal Response: Oriented  Best Motor Response: Obeys commands  Glasgow Coma Scale Score: 15                CIWA Assessment  BP: 123/76  Pulse: 85             PHYSICAL EXAM         ED Triage Vitals [12/28/23 1629]   BP Systolic BP Percentile Diastolic BP Percentile Temp Temp Source Pulse Respirations SpO2   123/76 -- -- 98.2 F (36.8 C) Oral 85 17 95 %      Height Weight - Scale         1.981 m (6' 6) (!) 141.5 kg (312 lb)           Constitutional: Mildly uncomfortable, vital signs are reviewed  Left leg: No tenderness at the proximal fibula or base of the fifth metatarsal.  Tenderness in the distal third of the fibula without crepitus or step-off.  The Achilles is intact.  Tenderness over the ATFL.  No medial joint line tenderness.  Motor and sensory intact in the left foot, normal DP pulse.  Good capillary refill      DIAGNOSTIC RESULTS   LABS:    Labs Reviewed - No data to display    All other labs were within normal range or not returned as of thisdictation.      IMAGING  XR ANKLE LEFT (MIN 3 VIEWS)    (Results Pending)     No results found.   No results found.       ED Course as of 12/28/23 1656   Wed Dec 28, 2023   1650 My independent review of the x-ray of  the left ankle is no fracture or dislocation.  Patient will be discharged with Ace wrap, crutches with weightbearing as tolerated, Tylenol  for discomfort, ice and elevation with primary care follow-up [SH]   1655 Findings on the x-ray and the treatment plan discussed with patient and family, comfortable with the plan outlined. [SH]      ED Course User Index  [SH] Dallie Glendia ORN, MD       PROCEDURES   Unless otherwise noted below, none     Procedures    CRITICAL CARE TIME   N/A    CONSULTS:  None    EMERGENCY DEPARTMENT COURSE and DIFFERENTIAL DIAGNOSIS/MDM:   Vitals:    Vitals:    12/28/23 1629   BP: 123/76   Pulse: 85   Resp: 17   Temp: 98.2 F (36.8 C)   TempSrc: Oral   SpO2: 95%   Weight: (!) 141.5 kg  (312 lb)   Height: 1.981 m (6' 6)       Patient was given the following medications:  Medications   acetaminophen  (TYLENOL ) tablet 1,000 mg (has no administration in time range)            The patient tolerated their visit well.   Thepatient and / or the family were informed of the results of any tests, a time was given to answer questions.    I am the Primary Clinician of Record.    FINAL IMPRESSION      1. Sprain of left ankle, unspecified ligament, initial encounter        DISPOSITION/PLAN   DISPOSITION Decision To Discharge 12/28/2023 04:56:20 PM   DISPOSITION CONDITION Stable           PATIENT REFERRED TO:  Your primary care provider    Schedule an appointment as soon as possible for a visit in 1 week  As needed      DISCHARGE MEDICATIONS:  New Prescriptions    No medications on file       DISCONTINUED MEDICATIONS:  Discontinued Medications    No medications on file              (Please note that portions of this note were completed with a voice recognition program.  Efforts were made to edit the dictations but occasionally words aremis-transcribed.)    Glendia ORN Dallie, MD (electronically signed)           Dallie Glendia ORN, MD  12/28/23 1655       Dallie Glendia ORN, MD  12/28/23 [phone]

## 2023-12-28 NOTE — Discharge Instructions (Addendum)
 Use the crutches with weightbearing as tolerated on the right leg.  Take Tylenol  every 6 hours as needed for discomfort.  This is safe with your current medications.  Apply ice to the ankle for 20 minutes several times over the course of the day and keep the leg elevated to reduce pain and swelling.  Follow-up with primary care in the coming week.  If symptoms persist, orthopedic referral would be appropriate.

## 2024-01-05 ENCOUNTER — Encounter

## 2024-01-23 NOTE — Telephone Encounter (Signed)
-----   Message from LPN Carolyn GAILS, LPN sent at 1/88/7974  2:57 PM EDT -----  Regarding: FW: [facility] Appointment Request    ----- Message -----  From: Adrien Norleen Ax Pintor  Sent: 01/16/2024   2:54 PM EDT  To: [facility]  Subject: [facility] Appointment Request                          [facility] Appointment Request    Patient needs appointment for [facility] Appointment Type: New Patient.    Patient Requested Dates(s):soonest  Patient Requested Time:anytime  Provider Name:Barbara CHRISTELLA Blush, APRN-CNP      Reason for Appointment Request: New Patient - No appointments available during search  --------------------------------------------------------------------------------------------------------------------------    Relationship to Patient: Spouse/Partner wife     Call Back Information: OK to leave message on voicemail  Preferred Call Back Number: Phone [phone]

## 2024-01-23 NOTE — Telephone Encounter (Signed)
 My Chart message sent

## 2024-01-30 ENCOUNTER — Ambulatory Visit: Payer: MEDICARE

## 2024-01-31 ENCOUNTER — Inpatient Hospital Stay: Admit: 2024-01-31 | Payer: MEDICARE

## 2024-01-31 ENCOUNTER — Inpatient Hospital Stay: Payer: MEDICARE

## 2024-01-31 ENCOUNTER — Ambulatory Visit: Payer: MEDICARE

## 2024-01-31 DIAGNOSIS — R079 Chest pain, unspecified: Principal | ICD-10-CM

## 2024-01-31 MED ORDER — REGADENOSON 0.4 MG/5ML IV SOLN
0.4 | INTRAVENOUS | Status: AC
Start: 2024-01-31 — End: 2024-01-31
  Administered 2024-01-31: 16:00:00 0.4 via INTRAVENOUS

## 2024-01-31 MED ORDER — TECHNETIUM TC 99M SESTAMIBI IV KIT
Freq: Once | INTRAVENOUS | Status: AC | PRN
Start: 2024-01-31 — End: 2024-01-31
  Administered 2024-01-31: 16:00:00 27 via INTRAVENOUS

## 2024-01-31 MED ORDER — TECHNETIUM TC 99M SESTAMIBI IV KIT
Freq: Once | INTRAVENOUS | Status: AC | PRN
Start: 2024-01-31 — End: 2024-01-31
  Administered 2024-01-31: 14:00:00 9.2 via INTRAVENOUS

## 2024-01-31 MED ORDER — REGADENOSON 0.4 MG/5ML IV SOLN
0.4 | Freq: Once | INTRAVENOUS | Status: AC | PRN
Start: 2024-01-31 — End: 2024-01-31

## 2024-01-31 MED FILL — LEXISCAN 0.4 MG/5ML IV SOLN: 0.4 MG/5ML | INTRAVENOUS | Qty: 5

## 2024-02-01 LAB — NM STRESS TEST WITH MYOCARDIAL PERFUSION
Baseline Diastolic BP: 71 mmHg
Baseline HR: 51 {beats}/min
Baseline Systolic BP: 115 mmHg
Diagnosis: NORMAL
Nuc Stress EF: 60 %
Stress Diastolic BP: 63 mmHg
Stress Estimated Workload: 1 METS
Stress Peak HR: 81 {beats}/min
Stress Percent HR Achieved: 50 %
Stress Rate Pressure Product: 9882 BPM*mmHg
Stress Systolic BP: 122 mmHg
Stress Target HR: 163 {beats}/min
TID: 1.18
